# Patient Record
Sex: Male | Born: 1985 | Race: White | Hispanic: Yes | Marital: Married | Smoking: Former smoker
Health system: Southern US, Community
[De-identification: ages and names within clinical notes are randomized; demographics above are authoritative.]

## PROBLEM LIST (undated history)

## (undated) DIAGNOSIS — M549 Dorsalgia, unspecified: Secondary | ICD-10-CM

---

## 2011-09-07 ENCOUNTER — Emergency Department (HOSPITAL_COMMUNITY): Payer: Managed Care, Other (non HMO)

## 2011-09-07 ENCOUNTER — Emergency Department (HOSPITAL_COMMUNITY)
Admission: EM | Admit: 2011-09-07 | Discharge: 2011-09-07 | Disposition: A | Discharge status: Home / Self Care | Payer: Managed Care, Other (non HMO) | Attending: Emergency Medicine | Admitting: Emergency Medicine

## 2011-09-07 DIAGNOSIS — M545 Low back pain, unspecified: Secondary | ICD-10-CM | POA: Insufficient documentation

## 2011-09-07 DIAGNOSIS — Y9289 Other specified places as the place of occurrence of the external cause: Secondary | ICD-10-CM | POA: Insufficient documentation

## 2011-09-07 DIAGNOSIS — S335XXA Sprain of ligaments of lumbar spine, initial encounter: Secondary | ICD-10-CM | POA: Insufficient documentation

## 2011-09-07 DIAGNOSIS — X500XXA Overexertion from strenuous movement or load, initial encounter: Secondary | ICD-10-CM | POA: Insufficient documentation

## 2011-10-04 ENCOUNTER — Emergency Department (HOSPITAL_COMMUNITY): Payer: Managed Care, Other (non HMO)

## 2011-10-04 ENCOUNTER — Emergency Department (HOSPITAL_COMMUNITY)
Admission: EM | Admit: 2011-10-04 | Discharge: 2011-10-04 | Disposition: A | Discharge status: Home / Self Care | Payer: Managed Care, Other (non HMO) | Attending: Emergency Medicine | Admitting: Emergency Medicine

## 2011-10-04 DIAGNOSIS — S80219A Abrasion, unspecified knee, initial encounter: Secondary | ICD-10-CM

## 2011-10-04 DIAGNOSIS — IMO0002 Reserved for concepts with insufficient information to code with codable children: Secondary | ICD-10-CM | POA: Insufficient documentation

## 2011-10-04 DIAGNOSIS — M25569 Pain in unspecified knee: Secondary | ICD-10-CM | POA: Insufficient documentation

## 2011-10-04 DIAGNOSIS — S8990XA Unspecified injury of unspecified lower leg, initial encounter: Secondary | ICD-10-CM

## 2011-10-04 DIAGNOSIS — W11XXXA Fall on and from ladder, initial encounter: Secondary | ICD-10-CM | POA: Insufficient documentation

## 2011-10-04 NOTE — ED Notes (Signed)
Fell off lladder 8-10 feet yesterday and hurt  Rt knee  And left wrist hurt did not bump head  Denies loc is able to ambulate

## 2011-10-04 NOTE — Progress Notes (Signed)
Orthopedic Tech Progress Note Patient Details:  Jhon Mallozzi [facility] [birth year] [id number]   knee support   Cammer, Mickie Bail 10/04/2011, 10:56 AM

## 2011-10-04 NOTE — ED Provider Notes (Signed)
Medical screening examination/treatment/procedure(s) were performed by non-physician practitioner and as supervising physician I was immediately available for consultation/collaboration.  Cerria Randhawa, MD 10/04/11 1736 

## 2011-10-04 NOTE — ED Provider Notes (Signed)
History    patient was using ladder at work, when he lost balance and fell on his right knee. He denies hitting his head or loss of consciousness. He was able to ambulate afterward but complaining of right knee pain especially with flexion. He denies headache, neck pain, chest pain, shortness of breath, pelvic pain, hip pain, ankle pain, weakness, or numbness. He does notice an abrasion to his right patella region. He does complain of mild tenderness to left wrist was able to move, and use wrist without difficulty.  CSN: [id number] Arrival date & time: 10/04/2011  9:46 AM   First MD Initiated Contact with Patient 10/04/11 [phone]      No chief complaint on file.   (Consider location/radiation/quality/duration/timing/severity/associated sxs/prior treatment) Patient is a 25 y.o. male presenting with fall.  Fall The accident occurred 12 to 24 hours ago. The fall occurred from a ladder. He fell from a height of 6 to 10 ft. He landed on concrete. There was no blood loss. The point of impact was the right knee. The pain is present in the right knee. The pain is at a severity of 6/10. The pain is moderate. He was ambulatory at the scene. There was no entrapment after the fall. There was no drug use involved in the accident. There was no alcohol use involved in the accident. The symptoms are aggravated by flexion and use of the injured limb. He has tried nothing for the symptoms. The treatment provided no relief.    No past medical history on file.  No past surgical history on file.  No family history on file.  History  Substance Use Topics  . Smoking status: Not on file  . Smokeless tobacco: Not on file  . Alcohol Use: Not on file      Review of Systems  All other systems reviewed and are negative.    Allergies  Review of patient's allergies indicates no known allergies.  Home Medications  No current outpatient prescriptions on file.  BP 123/73  Pulse 62  Temp(Src) 98.2 F (36.8  C) (Oral)  SpO2 99%  Physical Exam  Nursing note and vitals reviewed. Constitutional:       Awake, alert, nontoxic appearance  HENT:  Head: Atraumatic.  Eyes: Right eye exhibits no discharge. Left eye exhibits no discharge.  Neck: Neck supple.  Pulmonary/Chest: Effort normal. He exhibits no tenderness.  Abdominal: There is no tenderness. There is no rebound.  Musculoskeletal: He exhibits no tenderness.       Right knee: He exhibits effusion. He exhibits no swelling, no LCL laxity and normal patellar mobility. tenderness found. Patellar tendon tenderness noted.       Right hand: He exhibits normal range of motion, no tenderness, no bony tenderness, no deformity and no swelling.       Legs:      Baseline ROM, no obvious new focal weakness  Neurological:       Mental status and motor strength appears baseline for patient and situation  Skin: No rash noted.  Psychiatric: He has a normal mood and affect.    ED Course  Procedures (including critical care time)  Labs Reviewed - No data to display No results found.   No diagnosis found.  No results found for this or any previous visit. Dg Knee Complete 4 Views Right  10/04/2011  *RADIOLOGY REPORT*  Clinical Data: Knee pain and laceration post fall  RIGHT KNEE - COMPLETE 4+ VIEW  Comparison: None  Findings: Bone mineralization  normal. Joint spaces preserved. No fracture, dislocation, or bone destruction. No joint effusion. No radiopaque foreign body identified.  IMPRESSION: No acute abnormalities.  Original Report Authenticated By: Lollie Marrow, M.D.      MDM  10:13 AM Right knee injury secondary to fall. Decrease flexion due to pain. Pain most significant to posterior aspects of patella. Abrasion noted overlying right patella.  Crepitus can be felt on palpation.  Right knee x-ray shows no acute abnormalities. Patient will be given pain medication, knee sleeves, and refer to orthopedics for further management.  Patient is able to  ambulate, and refuse crutches.        Fayrene Helper, PA Resident 10/04/11 [phone]

## 2011-10-04 NOTE — ED Notes (Signed)
Pt is ambulatory without difficulty. Denies loc when he fell from ladder. Appears in nad

## 2013-02-19 ENCOUNTER — Emergency Department (HOSPITAL_COMMUNITY): Payer: Self-pay

## 2013-02-19 ENCOUNTER — Encounter (HOSPITAL_COMMUNITY): Payer: Self-pay | Admitting: Emergency Medicine

## 2013-02-19 ENCOUNTER — Emergency Department (HOSPITAL_COMMUNITY)
Admission: EM | Admit: 2013-02-19 | Discharge: 2013-02-19 | Disposition: A | Discharge status: Home / Self Care | Payer: Self-pay | Attending: Emergency Medicine | Admitting: Emergency Medicine

## 2013-02-19 DIAGNOSIS — F17200 Nicotine dependence, unspecified, uncomplicated: Secondary | ICD-10-CM | POA: Insufficient documentation

## 2013-02-19 DIAGNOSIS — M545 Low back pain, unspecified: Secondary | ICD-10-CM | POA: Insufficient documentation

## 2013-02-19 DIAGNOSIS — G8929 Other chronic pain: Secondary | ICD-10-CM | POA: Insufficient documentation

## 2013-02-19 DIAGNOSIS — M549 Dorsalgia, unspecified: Secondary | ICD-10-CM

## 2013-02-19 MED ORDER — HYDROCODONE-ACETAMINOPHEN 5-325 MG PO TABS: 1.0000 | ORAL_TABLET | Freq: Four times a day (QID) | ORAL | Status: DC | PRN

## 2013-02-19 MED ORDER — OXYCODONE-ACETAMINOPHEN 5-325 MG PO TABS
2.0000 | ORAL_TABLET | Freq: Once | ORAL | Status: AC
  Administered 2013-02-19: 2 via ORAL
  Filled 2013-02-19: qty 2

## 2013-02-19 MED ORDER — DEXAMETHASONE SODIUM PHOSPHATE 10 MG/ML IJ SOLN
10.0000 mg | Freq: Once | INTRAMUSCULAR | Status: AC
  Administered 2013-02-19: 10 mg via INTRAMUSCULAR
  Filled 2013-02-19: qty 1

## 2013-02-19 MED ORDER — MELOXICAM 15 MG PO TABS: 15.0000 mg | ORAL_TABLET | Freq: Every day | ORAL | Status: DC

## 2013-02-19 NOTE — ED Notes (Signed)
Pt was seen by Chiropractor 09/24/12 for lower back pain.  He was seen 24 times without relief.  Pt states the back pain is increasing.  Pt states he hurt his back lifting heavy object off floor.  Pt alert oriented X4

## 2013-02-19 NOTE — ED Notes (Signed)
Back pain  X 2 weeks hurts to move in a certain way

## 2013-02-19 NOTE — ED Notes (Signed)
Patient is alert and orientedx4.  Patient was explained discharge instructions and they understood them with no questions.  The patient's mother in law Christella Hartigan Ashok Pall is coming to take the patient home.

## 2013-02-19 NOTE — ED Provider Notes (Signed)
History     CSN: [id number]  Arrival date & time 02/19/13  1152   First MD Initiated Contact with Patient 02/19/13 1207      Chief Complaint  Patient presents with  . Back Pain    (Consider location/radiation/quality/duration/timing/severity/associated sxs/prior treatment) HPI  Patient presents with cc of BL LBP.  Patient had incident in October where he lifted a heavy object at work. He has seen a chriopractor for several months without relief and has been taking advil and tylenol.  The patient states that his pain was doing better for about a month, but has been worsening over the past to weeks. Patient is having difficulty performing ADLs including putting on his own pants, bending, and walking.  Denies  shooting pain in the legs.  Denies weakness, loss of bowel/bladder function or saddle anesthesia. Denies neck stiffness, headache, rash.  Denies fever or recent procedures to back. Denies urinary symptoms.    History reviewed. No pertinent past medical history.  No past surgical history on file.  No family history on file.  History  Substance Use Topics  . Smoking status: Current Every Day Smoker  . Smokeless tobacco: Not on file  . Alcohol Use: Yes      Review of Systems .Ten systems reviewed and are negative for acute change, except as noted in the HPI.   Allergies  Review of patient's allergies indicates no known allergies.  Home Medications   Current Outpatient Rx  Name  Route  Sig  Dispense  Refill  . ibuprofen (ADVIL,MOTRIN) 200 MG tablet   Oral   Take 400 mg by mouth every 6 (six) hours as needed for pain.         Marland Kitchen OVER THE COUNTER MEDICATION   Topical   Apply 1 application topically 3 (three) times daily as needed. "El Dragon"= muscle rub (spanish)         . HYDROcodone-acetaminophen (NORCO) 5-325 MG per tablet   Oral   Take 1-2 tablets by mouth every 6 (six) hours as needed for pain.   20 tablet   0   . meloxicam (MOBIC) 15 MG tablet    Oral   Take 1 tablet (15 mg total) by mouth daily. Take 1 daily with food.   10 tablet   0     BP 128/78  Pulse 94  Temp(Src) 98.2 F (36.8 C) (Oral)  Resp 16  SpO2 97%  Physical Exam  Nursing note and vitals reviewed. Constitutional: He is oriented to person, place, and time. He appears well-developed and well-nourished. No distress.  HENT:  Head: Normocephalic and atraumatic.  Eyes: Conjunctivae are normal. No scleral icterus.  Neck: Normal range of motion. Neck supple.  Cardiovascular: Normal rate, regular rhythm and normal heart sounds.   Pulmonary/Chest: Effort normal and breath sounds normal. No respiratory distress.  Abdominal: Soft. There is no tenderness.  Musculoskeletal: He exhibits tenderness. He exhibits no edema.  Back exam: antalgic gait, limited range of motion, tenderness noted lumbar paraspinals,no midline tenderness, normal reflexes and strength bilateral lower extremities,  Pain with BL hip flexion.   Neurological: He is alert and oriented to person, place, and time.  Skin: Skin is warm and dry. He is not diaphoretic.  Psychiatric: His behavior is normal.    ED Course  Procedures (including critical care time)  Labs Reviewed - No data to display Dg Lumbar Spine Complete  02/19/2013  *RADIOLOGY REPORT*  Clinical Data: 27 year old male low back pain radiating to the lower  extremities.  LUMBAR SPINE - COMPLETE 4+ VIEW  Comparison: 09/07/2011.  Findings: Normal thoracic segmentation. Bone mineralization is within normal limits.  Stable lumbar vertebral height and alignment.  No pars fracture.  Sacral ala and SI joints within normal limits. Visible lower thoracic levels appear stable and intact.  IMPRESSION: No acute osseous abnormality in the lumbar spine.   Original Report Authenticated By: Erskine Speed, M.D.      1. Back pain       MDM  7:26 PM BP 128/78  Pulse 94  Temp(Src) 98.2 F (36.8 C) (Oral)  Resp 16  SpO2 97% ppatient with negative xray.   I suspect some psoas spasm and spasm of the lumbar,  Negative straigh leg raises.  I will d/c paitent with pain control and ortho follow up. Patient with back pain.  No neurological deficits and normal neuro exam.  Patient can walk but states is painful.  No loss of bowel or bladder control.  No concern for cauda equina.  No fever, night sweats, weight loss, h/o cancer, IVDU.  RICE protocol and pain medicine indicated and discussed with patient.  The patient appears reasonably screened and/or stabilized for discharge and I doubt any other medical condition or other [facility] requiring further screening, evaluation, or treatment in the ED at this time prior to discharge.         Arthor Captain, PA-C 02/19/13 1928

## 2013-02-19 NOTE — ED Notes (Signed)
Has seen a chiropractor many times but he has not helped

## 2013-02-21 NOTE — ED Provider Notes (Signed)
Medical screening examination/treatment/procedure(s) were performed by non-physician practitioner and as supervising physician I was immediately available for consultation/collaboration.  Reuven Braver, MD 02/21/13 1648 

## 2013-02-24 ENCOUNTER — Other Ambulatory Visit (HOSPITAL_COMMUNITY): Payer: Self-pay | Admitting: Sports Medicine

## 2013-02-24 DIAGNOSIS — M545 Low back pain: Secondary | ICD-10-CM

## 2013-02-27 ENCOUNTER — Ambulatory Visit (HOSPITAL_COMMUNITY)
Admission: RE | Admit: 2013-02-27 | Discharge: 2013-02-27 | Disposition: A | Discharge status: Home / Self Care | Payer: Self-pay | Source: Ambulatory Visit | Attending: Sports Medicine | Admitting: Sports Medicine

## 2013-02-27 DIAGNOSIS — M545 Low back pain: Secondary | ICD-10-CM

## 2013-02-27 DIAGNOSIS — R209 Unspecified disturbances of skin sensation: Secondary | ICD-10-CM | POA: Insufficient documentation

## 2013-02-27 DIAGNOSIS — M5126 Other intervertebral disc displacement, lumbar region: Secondary | ICD-10-CM | POA: Insufficient documentation

## 2013-04-17 ENCOUNTER — Ambulatory Visit: Payer: Self-pay | Attending: Sports Medicine

## 2013-04-17 DIAGNOSIS — IMO0001 Reserved for inherently not codable concepts without codable children: Secondary | ICD-10-CM | POA: Insufficient documentation

## 2013-04-17 DIAGNOSIS — M545 Low back pain, unspecified: Secondary | ICD-10-CM | POA: Insufficient documentation

## 2013-04-21 ENCOUNTER — Ambulatory Visit: Payer: Self-pay

## 2013-04-23 ENCOUNTER — Ambulatory Visit: Payer: Self-pay | Admitting: Physical Therapy

## 2013-04-27 ENCOUNTER — Ambulatory Visit: Payer: Self-pay | Attending: Sports Medicine

## 2013-04-27 DIAGNOSIS — IMO0001 Reserved for inherently not codable concepts without codable children: Secondary | ICD-10-CM | POA: Insufficient documentation

## 2013-04-27 DIAGNOSIS — M545 Low back pain, unspecified: Secondary | ICD-10-CM | POA: Insufficient documentation

## 2013-04-29 ENCOUNTER — Ambulatory Visit: Payer: Self-pay | Admitting: Rehabilitation

## 2013-05-04 ENCOUNTER — Ambulatory Visit: Payer: Self-pay

## 2013-05-06 ENCOUNTER — Ambulatory Visit: Payer: Self-pay

## 2013-05-12 ENCOUNTER — Ambulatory Visit: Payer: Self-pay

## 2013-05-14 ENCOUNTER — Ambulatory Visit: Payer: Self-pay

## 2013-05-19 ENCOUNTER — Ambulatory Visit: Payer: Self-pay

## 2013-05-21 ENCOUNTER — Ambulatory Visit: Payer: Self-pay

## 2014-01-06 ENCOUNTER — Encounter (HOSPITAL_COMMUNITY): Payer: Self-pay | Admitting: Emergency Medicine

## 2014-01-06 ENCOUNTER — Emergency Department (INDEPENDENT_AMBULATORY_CARE_PROVIDER_SITE_OTHER)
Admission: EM | Admit: 2014-01-06 | Discharge: 2014-01-06 | Disposition: A | Discharge status: Home / Self Care | Payer: Self-pay | Source: Home / Self Care | Attending: Family Medicine | Admitting: Family Medicine

## 2014-01-06 DIAGNOSIS — A084 Viral intestinal infection, unspecified: Secondary | ICD-10-CM

## 2014-01-06 DIAGNOSIS — A088 Other specified intestinal infections: Secondary | ICD-10-CM

## 2014-01-06 MED ORDER — ONDANSETRON 4 MG PO TBDP
ORAL_TABLET | ORAL | Status: AC
  Filled 2014-01-06: qty 2

## 2014-01-06 MED ORDER — ONDANSETRON 4 MG PO TBDP
8.0000 mg | ORAL_TABLET | Freq: Once | ORAL | Status: AC
  Administered 2014-01-06: 8 mg via ORAL

## 2014-01-06 MED ORDER — PROMETHAZINE HCL 25 MG PO TABS: 25.0000 mg | ORAL_TABLET | Freq: Four times a day (QID) | ORAL | Status: DC | PRN

## 2014-01-06 NOTE — ED Provider Notes (Signed)
Willie Carter is a 28 y.o. male who presents to Urgent Care today for fever and diarrhea. Symptoms started yesterday. Fever at home was up to 102F. He's tried Tylenol which helped significantly. He also notes mild headache. No cough or congestion or trouble breathing. No significant sick contacts. No vomiting. He feels well otherwise. No significant abdominal pain.   No past medical history on file. History  Substance Use Topics  . Smoking status: Current Every Day Smoker  . Smokeless tobacco: Not on file  . Alcohol Use: Yes   ROS as above Medications: Current Facility-Administered Medications  Medication Dose Route Frequency Provider Last Rate Last Dose  . ondansetron (ZOFRAN-ODT) disintegrating tablet 8 mg  8 mg Oral Once Gregor Hams, MD       Current Outpatient Prescriptions  Medication Sig Dispense Refill  . ibuprofen (ADVIL,MOTRIN) 200 MG tablet Take 400 mg by mouth every 6 (six) hours as needed for pain.      Marland Kitchen OVER THE COUNTER MEDICATION Apply 1 application topically 3 (three) times daily as needed. "El Dragon"= muscle rub (spanish)      . promethazine (PHENERGAN) 25 MG tablet Take 1 tablet (25 mg total) by mouth every 6 (six) hours as needed for nausea or vomiting.  30 tablet  0    Exam:  BP 106/74  Pulse 88  Temp(Src) 100.1 F (37.8 C) (Oral)  Resp 19  SpO2 100% Gen: Well NAD HEENT: EOMI,  MMM Lungs: Normal work of breathing. CTABL Heart: RRR no MRG Abd: NABS, Soft. NT, ND no rebound or guarding Exts: Brisk capillary refill, warm and well perfused.    Assessment and Plan: 28 y.o. male with viral gastroenteritis. Plan to treat with Phenergan Tylenol and watchful waiting. Handout provided in [place].  Discussed warning signs or symptoms. Please see discharge instructions. Patient expresses understanding.    Gregor Hams, MD 01/06/14 [phone]

## 2014-01-06 NOTE — ED Notes (Signed)
Delay in discharge related to obtaining translator support.

## 2014-01-06 NOTE — Discharge Instructions (Signed)
Thank you for coming in today. Take phenergan as needed for vomiting.  Continue tylenol for fever.  If your belly pain worsens, or you have high fever, bad vomiting, blood in your stool or black tarry stool go to the Emergency Room.   Gastroenteritis viral (Viral Gastroenteritis) La gastroenteritis viral tambin es conocida como gripe del [place]. Este trastorno [employer] y el tubo digestivo. Puede causar diarrea y vmitos repentinos. La enfermedad generalmente dura entre 3 y 8 das. La [employer] de las personas desarrolla una respuesta inmunolgica. Con el tiempo, esto elimina el virus. Mientras se desarrolla esta respuesta natural, el virus puede afectar en forma importante su salud.  CAUSAS Muchos virus diferentes pueden causar gastroenteritis, por ejemplo el rotavirus o el norovirus. Estos virus pueden contagiarse al consumir alimentos o agua contaminados. Tambin puede contagiarse al compartir utensilios u otros artculos personales con una persona infectada o al tocar una superficie contaminada.  SNTOMAS Los sntomas ms comunes son diarrea y vmitos. Estos problemas pueden causar una prdida grave de lquidos corporales(deshidratacin) y un desequilibrio de sales corporales(electrolitos). Otros sntomas pueden ser:   Cristy Hilts.  Dolor de [place].  Fatiga.  Dolor abdominal. DIAGNSTICO  El mdico podr hacer el diagnstico de gastroenteritis viral basndose en los sntomas y el examen fsico Tambin pueden tomarle una muestra de materia fecal para diagnosticar la presencia de virus u otras infecciones.  TRATAMIENTO Esta enfermedad generalmente desaparece sin tratamiento. Los tratamientos estn dirigidos a Neurosurgeon. Los casos ms graves de gastroenteritis viral implican vmitos tan intensos que no es posible retener lquidos. En [employer], los lquidos deben administrarse a travs de una va intravenosa (IV).  INSTRUCCIONES PARA EL CUIDADO DOMICILIARIO  Beba suficientes  lquidos para mantener la orina clara o de color amarillo plido. Beba pequeas cantidades de lquido con frecuencia y aumente la cantidad segn la tolerancia.  Pida instrucciones especficas a su mdico con respecto a la rehidratacin.  Evite:  Alimentos que Science writer.  Alcohol.  Gaseosas.  TabacoEden Emms.  Bebidas con cafena.  Lquidos muy calientes o fros.  Alimentos muy grasos.  Comer demasiado a Radiographer, therapeutic.  Productos lcteos hasta 24 a 48 horas despus de que se detenga la diarrea.  Puede consumir probiticos. Los probiticos son cultivos activos de bacterias beneficiosas. Pueden disminuir la cantidad y el nmero de deposiciones diarreicas en el adulto. Se encuentran en los yogures con cultivos activos y en los suplementos.  Lave bien sus manos para evitar que se disemine el virus.  Slo tome medicamentos de venta libre o recetados para Glass blower/designer, las molestias o bajar la fiebre segn las indicaciones de su mdico. No administre aspirina a los nios. Los medicamentos antidiarreicos no son recomendables.  Consulte a su mdico si puede seguir tomando sus medicamentos recetados o de [employer].  Cumpla con todas las visitas de control, segn le indique su mdico. SOLICITE ATENCIN MDICA DE INMEDIATO SI:  No puede retener lquidos.  No hay emisin de orina durante 6 a 8 horas.  Le falta el aire.  Observa sangre en el vmito (se ve como caf molido) o en la materia fecal.  Siente dolor abdominal que empeora o se concentra en una zona pequea (se localiza).  Tiene nuseas o vmitos persistentes.  Tiene fiebre.  El paciente es un nio menor de 3 meses y [place].  El paciente es un nio mayor de 3 meses, tiene fiebre y sntomas persistentes.  El paciente es un nio mayor de 3  meses y tiene fiebre y sntomas que empeoran repentinamente.  El paciente es un beb y no tiene lgrimas cuando llora. ASEGRESE QUE:   Comprende estas  instrucciones.  Controlar su enfermedad.  Solicitar ayuda inmediatamente si no mejora o si empeora. Document Released: 11/12/2005 Document Revised: 02/04/2012 [facility] Patient Information 2014 [place], [place].

## 2014-01-06 NOTE — ED Notes (Signed)
Fever onset last night.  Seen by dr Georgina Snell prior to this nurse

## 2015-07-18 ENCOUNTER — Encounter: Payer: Self-pay | Admitting: Family Medicine

## 2015-07-18 ENCOUNTER — Ambulatory Visit (INDEPENDENT_AMBULATORY_CARE_PROVIDER_SITE_OTHER): Payer: 59 | Admitting: Family Medicine

## 2015-07-18 VITALS — BP 104/70 | HR 57 | Wt 223.0 lb

## 2015-07-18 DIAGNOSIS — M9905 Segmental and somatic dysfunction of pelvic region: Secondary | ICD-10-CM | POA: Diagnosis not present

## 2015-07-18 DIAGNOSIS — M9903 Segmental and somatic dysfunction of lumbar region: Secondary | ICD-10-CM

## 2015-07-18 DIAGNOSIS — M545 Low back pain, unspecified: Secondary | ICD-10-CM

## 2015-07-18 DIAGNOSIS — Q7649 Other congenital malformations of spine, not associated with scoliosis: Secondary | ICD-10-CM | POA: Diagnosis not present

## 2015-07-18 DIAGNOSIS — M999 Biomechanical lesion, unspecified: Secondary | ICD-10-CM | POA: Insufficient documentation

## 2015-07-18 DIAGNOSIS — M9904 Segmental and somatic dysfunction of sacral region: Secondary | ICD-10-CM

## 2015-07-18 MED ORDER — GABAPENTIN 100 MG PO CAPS: 100.0000 mg | ORAL_CAPSULE | Freq: Every day | ORAL | Status: DC

## 2015-07-18 NOTE — Progress Notes (Signed)
[name] [address], [place] [address] Phone: [phone] Subjective:     CC: Low back pain  NID:[id number] Willie Carter is a 29 y.o. male coming in with complaint of low back pain. Patient has had this pain for multiple years. Patient states that he had some pain that seemed to be exacerbated after an injury at work in 2014. Patient did have a workup for this previously. Patient had an MRI in 2014 that was reviewed by me. 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 Patient did undergo formal physical therapy but states that over the course of 24 weeks he did not have significant improvement. States that it seems that if he is in a flexed position for a great amount of time he has more of a tightness. Patient states no radiation down the leg but sometimes into the right buttocks. Always seems to be worse on the right side. Denies any weakness. States that at night can be difficult to follow sleep secondary to the discomfort. Denies any fevers chills or any abnormal weight loss. States that he was approximately 25-30 pounds lighter last year and was feeling better.  No past medical history on file. No past surgical history on file. Social History  Substance Use Topics  . Smoking status: Former Research scientist (life sciences)  . Smokeless tobacco: None  . Alcohol Use: No   No Known Allergies No family history on file.      Past medical history, social, surgical and family history all reviewed in electronic medical record.   Review of Systems: No headache, visual changes, nausea, vomiting, diarrhea, constipation, dizziness, abdominal pain, skin rash, fevers, chills, night sweats, weight loss, swollen lymph nodes, body aches, joint swelling, muscle aches, chest pain, shortness of breath, mood changes.   Objective Blood pressure 104/70, pulse 57, weight 223 lb (101.152  kg), SpO2 96 %.  General: No apparent distress alert and oriented x3 mood and affect normal, dressed appropriately.  HEENT: Pupils equal, extraocular movements intact  Respiratory: Patient's speak in full sentences and does not appear short of breath  Cardiovascular: No lower extremity edema, non tender, no erythema  Skin: Warm dry intact with no signs of infection or rash on extremities or on axial skeleton.  Abdomen: Soft nontender  Neuro: Cranial nerves II through XII are intact, neurovascularly intact in all extremities with 2+ DTRs and 2+ pulses.  Lymph: No lymphadenopathy of posterior or anterior cervical chain or axillae bilaterally.  Gait normal with good balance and coordination.  MSK:  Non tender with full range of motion and good stability and symmetric strength and tone of shoulders, elbows, wrist, hip, knee and ankles bilaterally.  Back Exam:  Inspection: Unremarkable poor core strength Motion: Flexion 35 deg, Extension 25 deg, Side Bending to 25 deg bilaterally,  Rotation to 25 deg bilaterally  SLR laying: Negative  XSLR laying: Negative  Palpable tenderness: Tenderness in the paraspinal musculature of the lumbar spine mostly over L4 and L5 as well as somewhat over the piriformis muscle. FABER: Positive right side Sensory change: Gross sensation intact to all lumbar and sacral dermatomes.  Reflexes: 2+ at both patellar tendons, 2+ at achilles tendons, Babinski's downgoing.  Strength at foot  Plantar-flexion: 5/5 Dorsi-flexion: 5/5 Eversion: 5/5 Inversion: 5/5  Leg strength  Quad: 5/5 Hamstring: 5/5 Hip flexor: 5/5 Hip abductors: 5/5  Gait unremarkable.  Osteopathic findings L2 flexed rotated and  side bent right Sacrum left on left Right ilium posterior   Impression and Recommendations:     This case required medical decision making of moderate complexity.

## 2015-07-18 NOTE — Assessment & Plan Note (Signed)
Patient does have low back pain. I think is mostly muscular skeletal. Patient does have the congenital spinal stenosis that likely contributing as well. We discussed though with patient about home exercises, icing protocol, as well as over-the-counter natural supplementations. She continues ibuprofen for breakthrough pain and will take gabapentin at night. Patient is having no radicular symptoms I think that this will likely be fine and patient will respond well to conservative therapy. Patient come back in 3-4 weeks for further evaluation and treatment.

## 2015-07-18 NOTE — Progress Notes (Signed)
Pre visit review using our clinic review tool, if applicable. No additional management support is needed unless otherwise documented below in the visit note. 

## 2015-07-18 NOTE — Assessment & Plan Note (Signed)
Decision today to treat with OMT was based on Physical Exam  After verbal consent patient was treated with HVLA, ME techniques in lumbar, sacral and pelvis areas  Patient tolerated the procedure well with improvement in symptoms  Patient given exercises, stretches and lifestyle modifications  See medications in patient instructions if given  Patient will follow up in 3-4 weeks

## 2015-07-18 NOTE — Patient Instructions (Addendum)
Good to see you.  Ice 20 minutes 2 times daily. Usually after activity and before bed. Exercises 3 times a week.  Tennisball in back right pocket with sitting a lot Sacroiliac Joint Mobilization and Rehab 1. Work on pretzel stretching, shoulder back and leg draped in front. 3-5 sets, 30 sec.. 2. hip abductor rotations. standing, hip flexion and rotation outward then inward. 3 sets, 15 reps. when can do comfortably, add ankle weights starting at 2 pounds.  3. cross over stretching - shoulder back to ground, same side leg crossover. 3-5 sets for 30 min..  4. rolling up and back knees to chest and rocking. 5. sacral tilt - 5 sets, hold for 5-10 seconds Exercises on wall.  Heel and butt touching.  Raise leg 6 inches and hold 2 seconds.  Down slow for count of 4 seconds.  1 set of 30 reps daily on both sides.  Turmeric 500mg  twice daily Vitamin D 2ooo IU daily Gabapentin 100mg  at night See me again in 3-4 weeks.

## 2015-08-15 ENCOUNTER — Encounter: Payer: Self-pay | Admitting: Family Medicine

## 2015-08-15 ENCOUNTER — Ambulatory Visit (INDEPENDENT_AMBULATORY_CARE_PROVIDER_SITE_OTHER)
Admission: RE | Admit: 2015-08-15 | Discharge: 2015-08-15 | Disposition: A | Discharge status: Home / Self Care | Payer: 59 | Source: Ambulatory Visit | Attending: Family Medicine | Admitting: Family Medicine

## 2015-08-15 ENCOUNTER — Ambulatory Visit (INDEPENDENT_AMBULATORY_CARE_PROVIDER_SITE_OTHER): Payer: 59 | Admitting: Family Medicine

## 2015-08-15 VITALS — BP 110/68 | HR 76 | Ht 66.0 in | Wt 220.0 lb

## 2015-08-15 DIAGNOSIS — M9904 Segmental and somatic dysfunction of sacral region: Secondary | ICD-10-CM | POA: Diagnosis not present

## 2015-08-15 DIAGNOSIS — M9905 Segmental and somatic dysfunction of pelvic region: Secondary | ICD-10-CM | POA: Diagnosis not present

## 2015-08-15 DIAGNOSIS — Q7649 Other congenital malformations of spine, not associated with scoliosis: Secondary | ICD-10-CM | POA: Diagnosis not present

## 2015-08-15 DIAGNOSIS — M9903 Segmental and somatic dysfunction of lumbar region: Secondary | ICD-10-CM | POA: Diagnosis not present

## 2015-08-15 DIAGNOSIS — M545 Low back pain, unspecified: Secondary | ICD-10-CM

## 2015-08-15 DIAGNOSIS — M25511 Pain in right shoulder: Secondary | ICD-10-CM | POA: Diagnosis not present

## 2015-08-15 DIAGNOSIS — M999 Biomechanical lesion, unspecified: Secondary | ICD-10-CM

## 2015-08-15 NOTE — Assessment & Plan Note (Signed)
Patient had more pain today than he said previously. There is a possibility for impingement as well as a possible labral pathology. No significant laxity but patient does have what appears to be more some some mild subluxation of the bicep tendon bilaterally. Patient given some home exercises and work with Product/process development scientist. We discussed icing regimen. Patient come back and see me again in 4-6 weeks. If continuing have pain we'll consider injection.

## 2015-08-15 NOTE — Patient Instructions (Signed)
Good to see you Ice is your friend Gabapentin to 200mg  at night New handout of exercises to alternate with the old With sitting a long time put a tennis ball in back left pocket.  We can do injection if needed See me again in 4-6 weeks.

## 2015-08-15 NOTE — Progress Notes (Signed)
Pre visit review using our clinic review tool, if applicable. No additional management support is needed unless otherwise documented below in the visit note. 

## 2015-08-15 NOTE — Assessment & Plan Note (Signed)
I do believe the patient having the congenital spinal stenosis you have flare from time to time. Encourage him Core strengthening exercises and remaining active will be the most beneficial things he can do. Patient will take the gabapentin still at night. We discussed icing regimen again in greater detail. We discussed proper lifting mechanics. Patient has any worsening symptoms we can consider an epidural pressure repeat imaging. We'll discuss that at follow-up again in 4-6 weeks.

## 2015-08-15 NOTE — Progress Notes (Signed)
[facility] [employer] [place], [place] [address] Phone: [phone] Subjective:     CC: Low back pain f/u  [id number] Willie Carter is a 29 y.o. male coming in with complaint of low back pain. History of moderate spinal stenosis. Patient's was started on gabapentin, home exercises, postural changes, and osteopathic manipulation. Patient states he did make some improvement for some time and then seems to be worsening. Patient had to do some yard work and states that the repetitive activity seem to make it worse. Denies any radiation down the leg that is significant but has intermittent on the left leg. Denies any weakness. States that he's been able to sleep fairly comfortably. Patient remains fairly active and continues to work most of the time he states.  Patient also complaining of right shoulder pain. Feels that it sometimes unstable. Has a clicking sensation that is painful. Seems to be on the anterior aspect of the shoulder. States that he did have a dislocation he feels like one week ago. Not truly from a fall. Patient denies any radiation down the hand of his states that the arm feels somewhat weak.  No past medical history on file. No past surgical history on file. Social History  Substance Use Topics  . Smoking status: Former Research scientist (life sciences)  . Smokeless tobacco: None  . Alcohol Use: No   No Known Allergies No family history on file.      Past medical history, social, surgical and family history all reviewed in electronic medical record.   Review of Systems: No headache, visual changes, nausea, vomiting, diarrhea, constipation, dizziness, abdominal pain, skin rash, fevers, chills, night sweats, weight loss, swollen lymph nodes, body aches, joint swelling, muscle aches, chest pain, shortness of breath, mood changes.   Objective Blood pressure 110/68, pulse 76, height 5\' 6"  (1.676 m), weight 220 lb (99.791 kg), SpO2 96 %.  General: No  apparent distress alert and oriented x3 mood and affect normal, dressed appropriately.  HEENT: Pupils equal, extraocular movements intact  Respiratory: Patient's speak in full sentences and does not appear short of breath  Cardiovascular: No lower extremity edema, non tender, no erythema  Skin: Warm dry intact with no signs of infection or rash on extremities or on axial skeleton.  Abdomen: Soft nontender  Neuro: Cranial nerves II through XII are intact, neurovascularly intact in all extremities with 2+ DTRs and 2+ pulses.  Lymph: No lymphadenopathy of posterior or anterior cervical chain or axillae bilaterally.  Gait normal with good balance and coordination.  MSK:  Non tender with full range of motion and good stability and symmetric strength and tone of  elbows, wrist, hip, knee and ankles bilaterally.  Shoulder: Right Inspection reveals no abnormalities, atrophy or asymmetry. Tender to palpation over the acromioclavicular joint and the bicep groove bilaterally right greater than left ROM is full in all planes. Rotator cuff strength normal throughout. Positive impingement with Neer and Hawkin's tests, empty can sign. Speeds and Yergason's tests normal. Positive labral pathology Normal scapular function observed. No painful arc and no drop arm sign. No apprehension sign     Back Exam:  Inspection: Continued poor core strength Motion: Flexion 35 deg, Extension 25 deg, Side Bending to 25 deg bilaterally,  Rotation to 25 deg bilaterally  SLR laying: Negative  XSLR laying: Negative  Palpable tenderness: Continue paraspinal musculature tenderness diffusely FABER: Continued tenderness on the right side Sensory change: Gross sensation intact to all lumbar and sacral dermatomes.  Reflexes:  2+ at both patellar tendons, 2+ at achilles tendons, Babinski's downgoing.  Strength at foot  Plantar-flexion: 5/5 Dorsi-flexion: 5/5 Eversion: 5/5 Inversion: 5/5  Leg strength  Quad: 5/5 Hamstring:  5/5 Hip flexor: 5/5 Hip abductors: 5/5  Gait unremarkable.  Osteopathic findings Thoracic T5 extended rotated inside that right L2 flexed rotated and side bent right Sacrum left on left Right ilium posterior  Procedure note 97110; 15 minutes spent for Therapeutic exercises as stated in above notes.  This included exercises focusing on stretching, strengthening, with significant focus on eccentric aspects. Shoulder Exercises that included:  Basic scapular stabilization to include adduction and depression of scapula Scaption, focusing on proper movement and good control Internal and External rotation utilizing a theraband, with elbow tucked at side entire time Rows with theraband  Proper technique shown and discussed handout in great detail with ATC.  All questions were discussed and answered.     Impression and Recommendations:     This case required medical decision making of moderate complexity.

## 2015-08-15 NOTE — Assessment & Plan Note (Signed)
Decision today to treat with OMT was based on Physical Exam  After verbal consent patient was treated with HVLA, ME techniques in lumbar, sacral and pelvis areas  Patient tolerated the procedure well with improvement in symptoms  Patient given exercises, stretches and lifestyle modifications  See medications in patient instructions if given  Patient will follow up in 4-6weeks

## 2015-09-19 ENCOUNTER — Encounter: Payer: Self-pay | Admitting: Family Medicine

## 2015-09-19 ENCOUNTER — Ambulatory Visit (INDEPENDENT_AMBULATORY_CARE_PROVIDER_SITE_OTHER): Payer: 59 | Admitting: Family Medicine

## 2015-09-19 VITALS — BP 132/84 | HR 87 | Ht 66.0 in | Wt 219.0 lb

## 2015-09-19 DIAGNOSIS — M9905 Segmental and somatic dysfunction of pelvic region: Secondary | ICD-10-CM

## 2015-09-19 DIAGNOSIS — Q7649 Other congenital malformations of spine, not associated with scoliosis: Secondary | ICD-10-CM

## 2015-09-19 DIAGNOSIS — M9903 Segmental and somatic dysfunction of lumbar region: Secondary | ICD-10-CM | POA: Diagnosis not present

## 2015-09-19 DIAGNOSIS — M25511 Pain in right shoulder: Secondary | ICD-10-CM

## 2015-09-19 DIAGNOSIS — M9904 Segmental and somatic dysfunction of sacral region: Secondary | ICD-10-CM

## 2015-09-19 DIAGNOSIS — M999 Biomechanical lesion, unspecified: Secondary | ICD-10-CM

## 2015-09-19 MED ORDER — IBUPROFEN-FAMOTIDINE 800-26.6 MG PO TABS: ORAL_TABLET | ORAL | Status: DC

## 2015-09-19 NOTE — Assessment & Plan Note (Signed)
Continues to have some signs radicular symptoms. We discussed with him that we may need to consider possible epidural. Discuss that further date if patient does not respond to the conservative therapy. Encourage him to continue to work on weight loss and posture. Patient is going to look into some weight loss supplements. We discussed that I do not think that prescription medications would be helpful. Patient declined nutrition consult. Patient will come back and see me again in 4 weeks for further evaluation and treatment.

## 2015-09-19 NOTE — Progress Notes (Signed)
[name] [place], [place] [address] Phone: [phone] Subjective:     CC: Low back pain f/u  Willie Carter is a 29 y.o. male coming in with complaint of low back pain. History of moderate spinal stenosis. Patient's was started on gabapentin, home exercises, postural changes, and osteopathic manipulation. Patient continues to make some small increasing and decreasing the pain. Patient still has some exacerbations from time to time. Taking gabapentin fairly regularly. Feels that regular ibuprofen has not been helpful. Would like to do something stronger. Has not been icing as mentioned when he does do the stretches it does feel better. No radicular symptoms down the legs..  Patient also complaining of right shoulder pain.not doing the exercises regularly. Continues to have a dull throbbing aching pain. Seems to come and go. States that maybe a little bit better from time to time. Still can stop him from activities second due to pain.  No past medical history on file. No past surgical history on file. Social History  Substance Use Topics  . Smoking status: Former Research scientist (life sciences)  . Smokeless tobacco: None  . Alcohol Use: No   No Known Allergies No family history on file.      Past medical history, social, surgical and family history all reviewed in electronic medical record.   Review of Systems: No headache, visual changes, nausea, vomiting, diarrhea, constipation, dizziness, abdominal pain, skin rash, fevers, chills, night sweats, weight loss, swollen lymph nodes, body aches, joint swelling, muscle aches, chest pain, shortness of breath, mood changes.   Objective Blood pressure 132/84, pulse 87, height 5\' 6"  (1.676 m), weight 219 lb (99.338 kg), SpO2 96 %.  General: No apparent distress alert and oriented x3 mood and affect normal, dressed appropriately.  HEENT: Pupils equal, extraocular movements intact  Respiratory:  Patient's speak in full sentences and does not appear short of breath  Cardiovascular: No lower extremity edema, non tender, no erythema  Skin: Warm dry intact with no signs of infection or rash on extremities or on axial skeleton.  Abdomen: Soft nontender  Neuro: Cranial nerves II through XII are intact, neurovascularly intact in all extremities with 2+ DTRs and 2+ pulses.  Lymph: No lymphadenopathy of posterior or anterior cervical chain or axillae bilaterally.  Gait normal with good balance and coordination.  MSK:  Non tender with full range of motion and good stability and symmetric strength and tone of  elbows, wrist, hip, knee and ankles bilaterally.  Shoulder: Right Inspection reveals no abnormalities, atrophy or asymmetry. Tender to palpation over the acromioclavicular joint and the bicep groove bilaterally right greater than left ROM is full in all planes. Rotator cuff strength normal throughout. Positive impingement with Neer and Hawkin's tests, empty can sign. Speeds and Yergason's tests normal. Positive labral pathology Normal scapular function observed. No painful arc and no drop arm sign. No apprehension sign No change from previous exam  Body mass index is 35.36 kg/(m^2).    Back Exam:  Inspection: Continued poor core strength Motion: Flexion 35 deg, Extension 25 deg, Side Bending to 25 deg bilaterally,  Rotation to 25 deg bilaterally  SLR laying: Negative  XSLR laying: Negative  Palpable tenderness: Continue paraspinal musculature tenderness diffusely FABER: Continued tenderness on the right side Sensory change: Gross sensation intact to all lumbar and sacral dermatomes.  Reflexes: 2+ at both patellar tendons, 2+ at achilles tendons, Babinski's downgoing.  Strength at foot  Plantar-flexion: 5/5 Dorsi-flexion: 5/5 Eversion: 5/5 Inversion:  5/5  Leg strength  Quad: 5/5 Hamstring: 5/5 Hip flexor: 5/5 Hip abductors: 5/5  Gait unremarkable.  Osteopathic  findings Thoracic T5 extended rotated inside that right L2 flexed rotated and side bent right Sacrum left on left Right ilium posterior   After informed written and verbal consent, patient was seated on exam table. Right shoulder was prepped with alcohol swab and utilizing posterior approach, patient's right glenohumeral space was injected with 4:1  marcaine 0.5%: Kenalog 40mg /dL. Patient tolerated the procedure well without immediate complications.     Impression and Recommendations:     This case required medical decision making of moderate complexity.

## 2015-09-19 NOTE — Patient Instructions (Addendum)
Good to see you Ice 20 minutes 2 times daily. Usually after activity and before bed and ice shoulder in 6 hours Try to do those exercises 3 times a week for the shoulder and back.  We can consider a epidural for the back at some pointif needed Continue to stay active.  Look into to weight loss clinics See me again in 4 weeks.

## 2015-09-19 NOTE — Assessment & Plan Note (Signed)
Decision today to treat with OMT was based on Physical Exam  After verbal consent patient was treated with HVLA, ME techniques in lumbar, sacral and pelvis areas  Patient tolerated the procedure well with improvement in symptoms  Patient given exercises, stretches and lifestyle modifications  See medications in patient instructions if given  Patient will follow up in 4-6weeks

## 2015-09-19 NOTE — Progress Notes (Signed)
Pre visit review using our clinic review tool, if applicable. No additional management support is needed unless otherwise documented below in the visit note. 

## 2015-09-19 NOTE — Assessment & Plan Note (Signed)
Discussed with patient again at length. Patient elected try an injection today. Tolerated the procedure very well. Hopefully this will be beneficial. Declined formal physical therapy. Patient will monitor and keep hands within peripheral vision. We discussed lifting mechanics. Patient come back and see me again in 4 weeks. Continued have pain advance imaging may be warranted.

## 2015-10-13 ENCOUNTER — Telehealth: Payer: Self-pay | Admitting: Family Medicine

## 2015-10-13 NOTE — Telephone Encounter (Signed)
Wife called in.  She states that patients back is hurting really bad.  States he can not put his head down, take a shower or hardly drive.  Would like to be worked in around lunch if possible because patient is 3 hours away.  If you can not reach patient, wife is asking to try her at [phone] ([email or web address]).

## 2015-10-13 NOTE — Telephone Encounter (Signed)
Spoke to wend, the pt's wife. Scheduled him for tomorroat @930a .

## 2015-10-14 ENCOUNTER — Ambulatory Visit (INDEPENDENT_AMBULATORY_CARE_PROVIDER_SITE_OTHER): Payer: 59 | Admitting: Family Medicine

## 2015-10-14 ENCOUNTER — Encounter: Payer: Self-pay | Admitting: Family Medicine

## 2015-10-14 ENCOUNTER — Ambulatory Visit (INDEPENDENT_AMBULATORY_CARE_PROVIDER_SITE_OTHER)
Admission: RE | Admit: 2015-10-14 | Discharge: 2015-10-14 | Disposition: A | Discharge status: Home / Self Care | Payer: 59 | Source: Ambulatory Visit | Attending: Family Medicine | Admitting: Family Medicine

## 2015-10-14 VITALS — BP 120/84 | HR 70 | Ht 66.0 in | Wt 218.0 lb

## 2015-10-14 DIAGNOSIS — Q7649 Other congenital malformations of spine, not associated with scoliosis: Secondary | ICD-10-CM

## 2015-10-14 MED ORDER — PREDNISONE 50 MG PO TABS: 50.0000 mg | ORAL_TABLET | Freq: Every day | ORAL | Status: DC

## 2015-10-14 MED ORDER — KETOROLAC TROMETHAMINE 30 MG/ML IJ SOLN
60.0000 mg | Freq: Once | INTRAMUSCULAR | Status: AC
  Administered 2015-10-14: 60 mg via INTRAMUSCULAR

## 2015-10-14 MED ORDER — METHYLPREDNISOLONE ACETATE 80 MG/ML IJ SUSP
80.0000 mg | Freq: Once | INTRAMUSCULAR | Status: AC
  Administered 2015-10-14: 80 mg via INTRAMUSCULAR

## 2015-10-14 NOTE — Assessment & Plan Note (Signed)
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 Last imaging was greater than 2 years ago. X-rays are going to be ordered today. With patient having radicular symptoms I do think that there is a possibility for patient having a nerve root impingement or exacerbation of the spinal stenosis. Patient would like to try conservative therapy. Patient will try prednisone, continue on the gabapentin, has medications for breakthrough pain. I would like to get labs to rule out any other possible pathology that can be contributing such as autoimmune diseases, or infectious etiology. Patient will come back after the weekend. If continuing to have the discomfort at think that advance imaging could be appropriate. Patient knows if any weakness, numbness or any bowel or bladder incontinence to seek medical attention immediately.  Spent  25 minutes with patient face-to-face and had greater than 50% of counseling including as described above in assessment and plan.

## 2015-10-14 NOTE — Patient Instructions (Addendum)
Good to see you Willie Carter is your friend 2 injections today Prednisone daily for 5 days after today Xray downstairs and labs today.  Call me Monday if not better and we will get MRI Otherwise see me again in 10-14 days.

## 2015-10-14 NOTE — Progress Notes (Signed)
Pre visit review using our clinic review tool, if applicable. No additional management support is needed unless otherwise documented below in the visit note. 

## 2015-10-14 NOTE — Progress Notes (Signed)
  [name] [place], [place] [address] Phone: [phone] Subjective:     CC: Low back pain f/u  [id number] Willie Carter is a 29 y.o. male coming in with complaint of low back pain. History of moderate spinal stenosis. Patient's was started on gabapentin, home exercises, postural changes, and osteopathic manipulation. Patient on the course last 4 days has had an exacerbation. Having more pain that seems to be radiating down especially the right leg. Mild weakness. So painful that he cannot sit, weight down or stand without pain. Feels that his legs are fatigued at all times. No over-the-counter medications has been beneficial. Has tried even increase his gabapentin without any relief. Having difficulty even sleeping or do daily activities. Has not been able to work for the last 72 hours.   No past medical history on file. No past surgical history on file. Social History  Substance Use Topics  . Smoking status: Former Research scientist (life sciences)  . Smokeless tobacco: None  . Alcohol Use: No   No Known Allergies No family history on file.      Past medical history, social, surgical and family history all reviewed in electronic medical record.   Review of Systems: No headache, visual changes, nausea, vomiting, diarrhea, constipation, dizziness, abdominal pain, skin rash, fevers, chills, night sweats, weight loss, swollen lymph nodes, body aches, joint swelling, muscle aches, chest pain, shortness of breath, mood changes.   Objective Blood pressure 120/84, pulse 70, height 5\' 6"  (1.676 m), weight 218 lb (98.884 kg), SpO2 97 %.  General: No apparent distress alert and oriented x3 mood and affect normal, dressed appropriately.  HEENT: Pupils equal, extraocular movements intact  Respiratory: Patient's speak in full sentences and does not appear short of breath  Cardiovascular: No lower extremity edema, non tender, no erythema  Skin: Warm dry intact with  no signs of infection or rash on extremities or on axial skeleton.  Abdomen: Soft nontender  Neuro: Cranial nerves II through XII are intact, neurovascularly intact in all extremities with 2+ DTRs and 2+ pulses.  Lymph: No lymphadenopathy of posterior or anterior cervical chain or axillae bilaterally.  Gait normal with good balance and coordination.  MSK:  Non tender with full range of motion and good stability and symmetric strength and tone of  elbows, wrist, hip, knee and ankles bilaterally.    Back Exam:  Inspection: Continued poor core strength very uncomfortable sitting in chair. Motion: Flexion 25 deg, Extension 15 deg, Side Bending to 25 deg bilaterally,  Rotation to 25 deg bilaterally worsening range of motion than previously SLR laying: Positive right side XSLR laying: Negative  Palpable tenderness: Severely tender to palpation mostly over L4-L5 paraspinal musculature bilaterally right greater than left FABER: Continued tenderness on the right side and seems to be more severe Sensory change: Gross sensation intact to all lumbar and sacral dermatomes.  Reflexes: 2+ at both patellar tendons, 2+ at achilles tendons, Babinski's downgoing.  Strength at foot  Plantar-flexion: 5/5 Dorsi-flexion: 5/5 Eversion: 5/5 Inversion: 5/5  Leg strength  Quad: 5/5 Hamstring: 5/5 Hip flexor: 5/5 Hip abductors: 5/5  Gait unremarkable.       Impression and Recommendations:     This case required medical decision making of moderate complexity.

## 2015-10-17 ENCOUNTER — Ambulatory Visit: Payer: Self-pay | Admitting: Family Medicine

## 2015-10-17 ENCOUNTER — Telehealth: Payer: Self-pay | Admitting: Family Medicine

## 2015-10-17 DIAGNOSIS — Q7649 Other congenital malformations of spine, not associated with scoliosis: Secondary | ICD-10-CM

## 2015-10-17 MED ORDER — PREDNISONE 50 MG PO TABS: 50.0000 mg | ORAL_TABLET | Freq: Every day | ORAL | Status: DC

## 2015-10-17 NOTE — Telephone Encounter (Signed)
Willie Carter is calling to give a status update on the patient. She states that the pharmacy did not receive prednisone. She states that he is not as bad as when he came into the office, but is certainly still in pain. He feels pressure in his back and it will not allow him to sleep.

## 2015-10-17 NOTE — Telephone Encounter (Signed)
lmovm to let pt know prednisone was resent into his pharmacy.

## 2015-10-18 NOTE — Telephone Encounter (Signed)
Spoke to pt's wife this morning. They picked up the prednisone & they would like to go ahead & order an MRI.  Order entered.

## 2015-10-28 ENCOUNTER — Ambulatory Visit: Payer: Self-pay | Admitting: Family Medicine

## 2015-11-10 ENCOUNTER — Ambulatory Visit
Admission: RE | Admit: 2015-11-10 | Discharge: 2015-11-10 | Disposition: A | Discharge status: Home / Self Care | Payer: 59 | Source: Ambulatory Visit | Attending: Family Medicine | Admitting: Family Medicine

## 2015-11-10 DIAGNOSIS — Q7649 Other congenital malformations of spine, not associated with scoliosis: Secondary | ICD-10-CM

## 2015-11-25 ENCOUNTER — Encounter: Payer: Self-pay | Admitting: Family Medicine

## 2015-11-25 ENCOUNTER — Ambulatory Visit (INDEPENDENT_AMBULATORY_CARE_PROVIDER_SITE_OTHER): Payer: 59 | Admitting: Family Medicine

## 2015-11-25 VITALS — BP 118/82 | HR 77 | Ht 66.0 in | Wt 219.0 lb

## 2015-11-25 DIAGNOSIS — Q7649 Other congenital malformations of spine, not associated with scoliosis: Secondary | ICD-10-CM | POA: Diagnosis not present

## 2015-11-25 NOTE — Assessment & Plan Note (Signed)
Discussed with patient and family for long amount of time. We discussed that this could be something that will take time to fully healed. We discussed the possibility of using an epidural for diagnostic as well as possible therapeutic purposes. We also discussed formal physical therapy. Patient declined both of these with him improving at this time. Encourage core strengthening and continuing to watch his weight. We discussed icing regimen as well as what medications and take when he does have exacerbation. We discussed that the gabapentin is fine and if he needs muscle relaxer we can discuss. Patient does well he can follow up with me again in 4-6 weeks. Patient has any worsening symptoms he will call me and we will potentially order the epidural as we discussed previously.  Spent  25 minutes with patient face-to-face and had greater than 50% of counseling including as described above in assessment and plan.

## 2015-11-25 NOTE — Progress Notes (Signed)
  [name] [place], [place] [address] Phone: [phone] Subjective:     CC: Low back pain f/u  [id number] Willie Carter is a 29 y.o. male coming in with complaint of low back pain. History of moderate spinal stenosis. Patient was not responding to conservative therapy and was sent for an MRI. Patient's MRI was individually visualized and reviewed by me today. Patient's MRI showed patient still had some mild disc bulging at multiple levels and mild to moderate spinal stenosis but actually some improvement from previous exam. Patient's most concerning area was L4-L5. Patient states though that since he has changes diet and has become more active he has noticed some decreasing pain. Patient states that he is notices long as he is changes diet and avoiding significant heavy lifting he has been doing relatively well. No radiation down the legs. Not as much tightness in the legs either.   No past medical history on file. No past surgical history on file. Social History  Substance Use Topics  . Smoking status: Former Research scientist (life sciences)  . Smokeless tobacco: None  . Alcohol Use: No   No Known Allergies No family history on file. no family history that is pertinent to chief complaint      Past medical history, social, surgical and family history all reviewed in electronic medical record.   Review of Systems: No headache, visual changes, nausea, vomiting, diarrhea, constipation, dizziness, abdominal pain, skin rash, fevers, chills, night sweats, weight loss, swollen lymph nodes, body aches, joint swelling, muscle aches, chest pain, shortness of breath, mood changes.   Objective Blood pressure 118/82, pulse 77, height 5\' 6"  (1.676 m), weight 219 lb (99.338 kg), SpO2 96 %.  General: No apparent distress alert and oriented x3 mood and affect normal, dressed appropriately.  HEENT: Pupils equal, extraocular movements intact  Respiratory: Patient's speak  in full sentences and does not appear short of breath  Cardiovascular: No lower extremity edema, non tender, no erythema  Skin: Warm dry intact with no signs of infection or rash on extremities or on axial skeleton.  Abdomen: Soft nontender  Neuro: Cranial nerves II through XII are intact, neurovascularly intact in all extremities with 2+ DTRs and 2+ pulses.  Lymph: No lymphadenopathy of posterior or anterior cervical chain or axillae bilaterally.  Gait normal with good balance and coordination.  MSK:  Non tender with full range of motion and good stability and symmetric strength and tone of  elbows, wrist, hip, knee and ankles bilaterally.    Back Exam:  Inspection: Continued poor core strength very uncomfortable sitting in chair. Motion: Flexion 25 deg, Extension 20 deg, Side Bending to 25 deg bilaterally,  Rotation to 30 deg bilaterally mild improvement SLR laying: Negative XSLR laying: Negative  Palpable tenderness: S still mild discomfort over L3-L4 paraspinal musculature. FABER: Continued tightness bilaterally record of the left Sensory change: Gross sensation intact to all lumbar and sacral dermatomes.  Reflexes: 2+ at both patellar tendons, 2+ at achilles tendons, Babinski's downgoing.  Strength at foot  Plantar-flexion: 5/5 Dorsi-flexion: 5/5 Eversion: 5/5 Inversion: 5/5  Leg strength  Quad: 5/5 Hamstring: 5/5 Hip flexor: 5/5 Hip abductors: 5/5  Gait unremarkable.       Impression and Recommendations:     This case required medical decision making of moderate complexity.

## 2015-11-25 NOTE — Progress Notes (Signed)
Pre visit review using our clinic review tool, if applicable. No additional management support is needed unless otherwise documented below in the visit note. 

## 2016-05-21 IMAGING — MR MR LUMBAR SPINE W/O CM
5 series · 45 of 48 positions shown · non-contrast
Comparison: Lumbar MRI 02/27/2013

CLINICAL DATA: Congenital spinal stenosis. Low back pain with
bilateral leg pain

EXAM:
MRI LUMBAR SPINE WITHOUT CONTRAST
TECHNIQUE: Multiplanar, multisequence MR imaging of the lumbar spine was
performed. No intravenous contrast was administered.

[Series 3: tirm sag · sagittal · 4.0mm · 0.55mm/px · 6 of 13 slices shown]
[im 1/13]
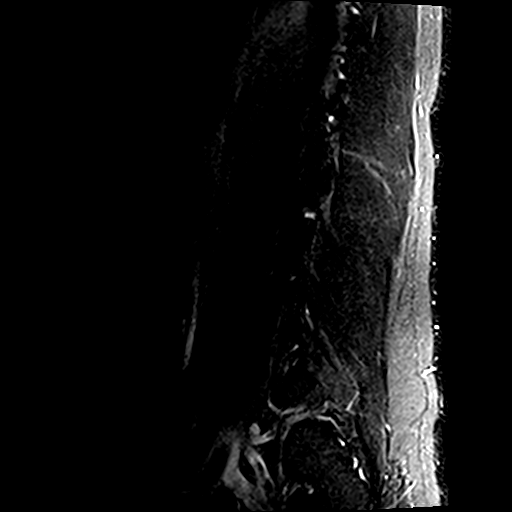
[im 3/13]
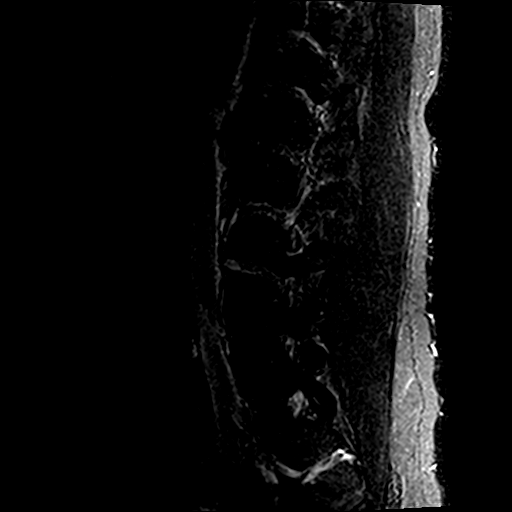
[im 5/13]
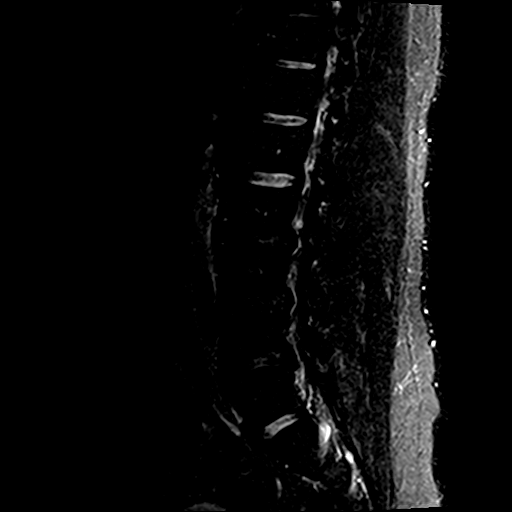
[im 8/13]
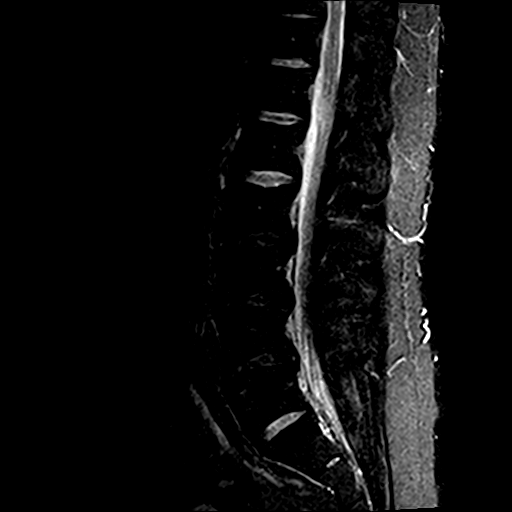
[im 10/13]
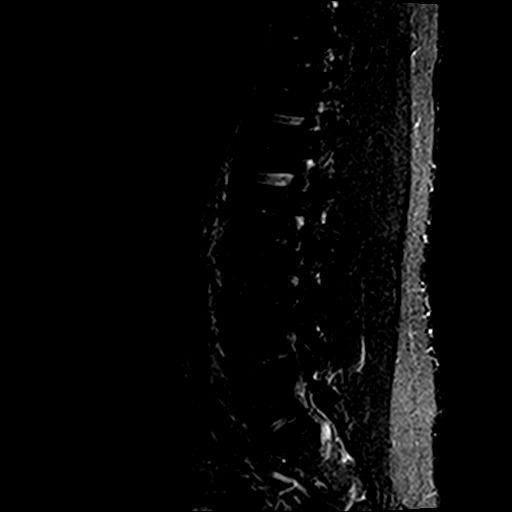
[im 13/13]
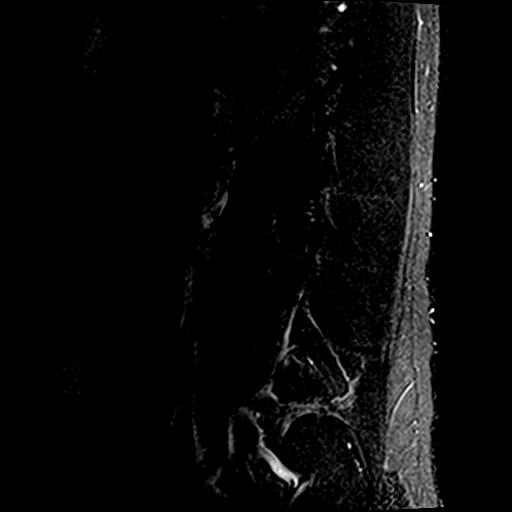

[Series 4: T2 · sagittal · 4.0mm · 0.88mm/px · 6 of 13 slices shown (1 of 2)]
[im 1/13]
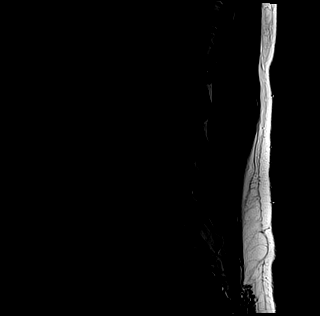
[im 3/13]
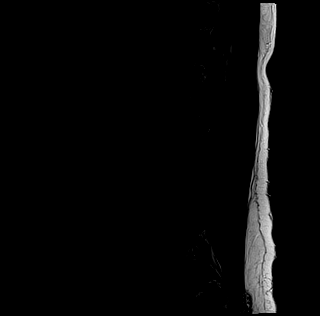
[im 5/13]
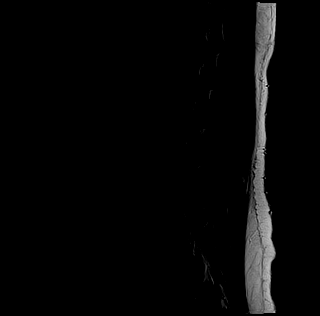
[im 8/13]
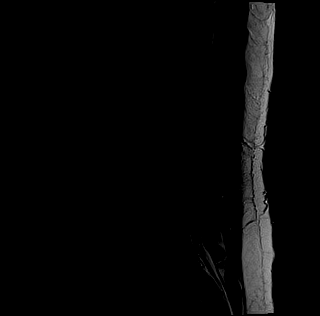
[im 10/13]
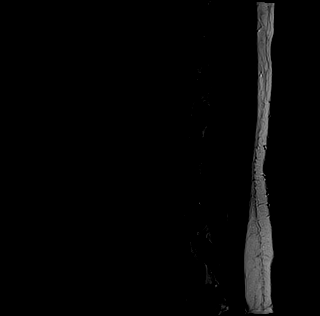
[im 13/13]
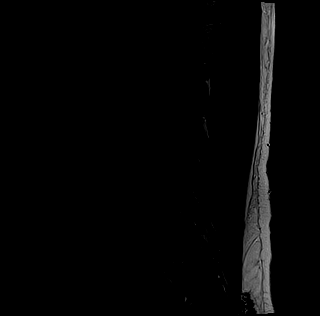

[Series 5: T1 · sagittal · 4.0mm · 0.88mm/px · 6 of 13 slices shown (1 of 2)]
[im 1/13]
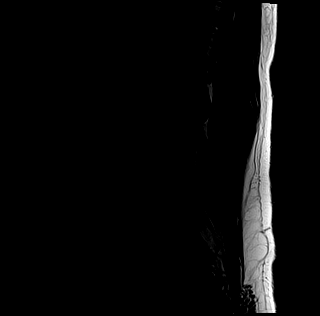
[im 3/13]
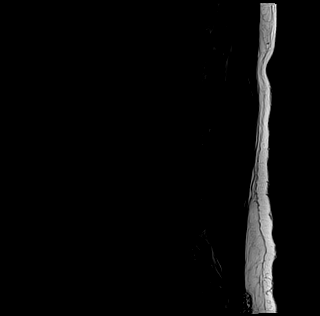
[im 5/13]
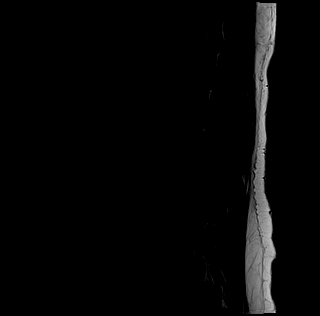
[im 8/13]
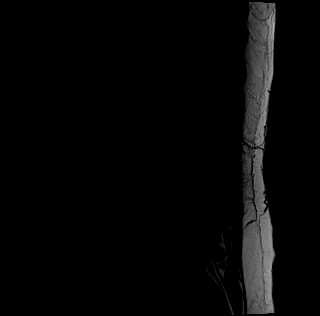
[im 10/13]
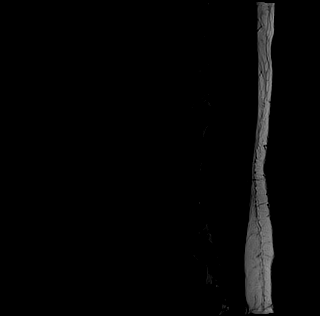
[im 13/13]
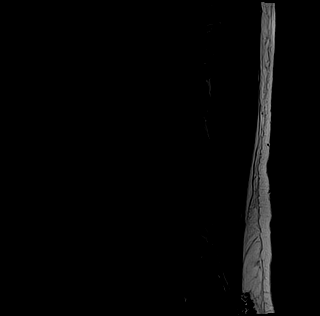

[Series 6: T1 · axial · 4.0mm · 0.70mm/px · z∈[-90,+85]mm · 12 of 33 slices shown (2 of 2)]
[im 1/33]
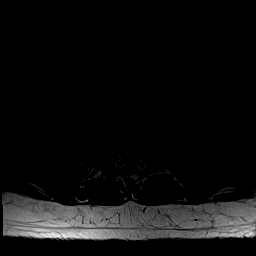
[im 3/33]
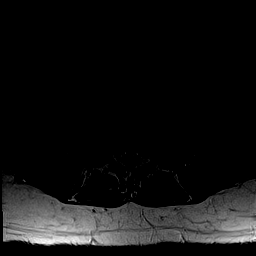
[im 5/33]
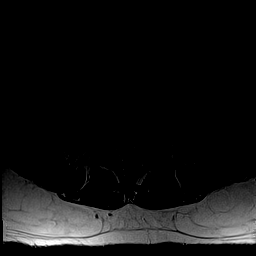
[im 7/33]
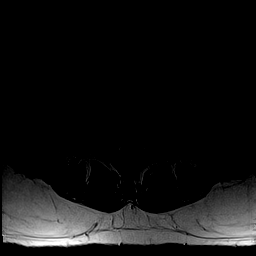
[im 10/33]
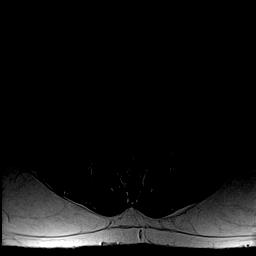
[im 12/33]
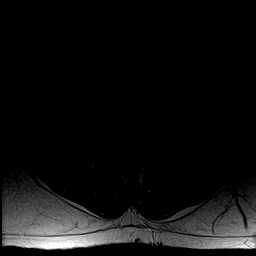
[im 14/33]
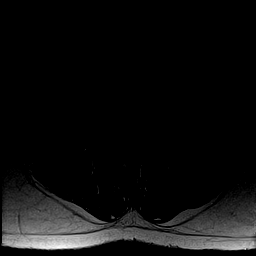
[im 17/33]
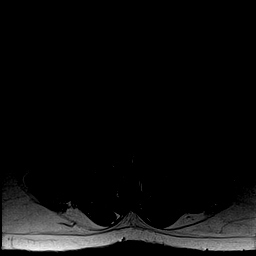
[im 19/33]
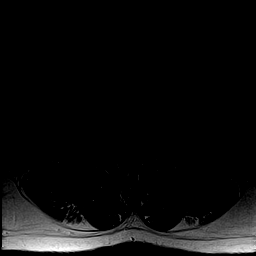
[im 23/33]
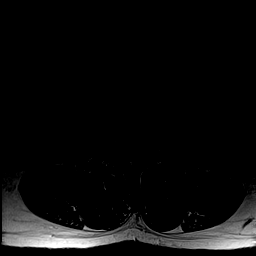
[im 28/33]
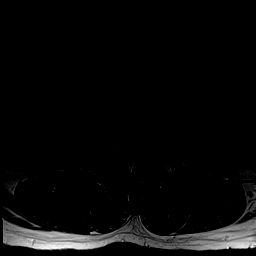
[im 33/33]
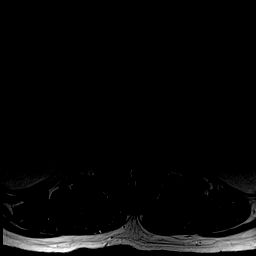

[Series 7: T2 · axial · 4.0mm · 0.70mm/px · z∈[-90,+85]mm · 15 of 33 slices shown (2 of 2)]
[im 1/33]
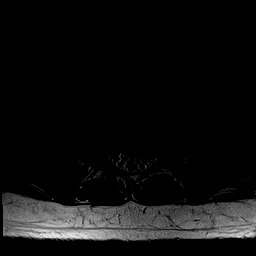
[im 3/33]
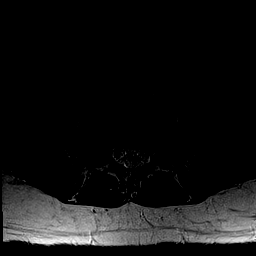
[im 5/33]
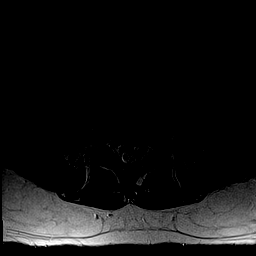
[im 7/33]
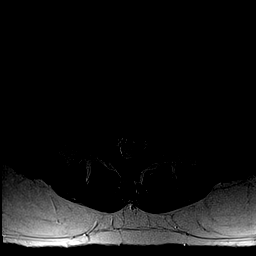
[im 10/33]
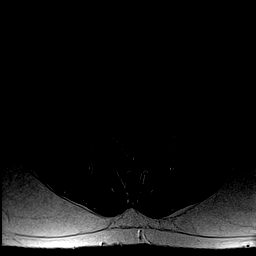
[im 12/33]
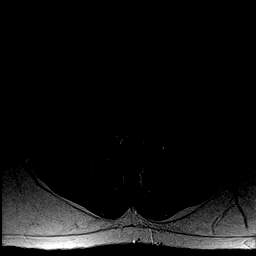
[im 14/33]
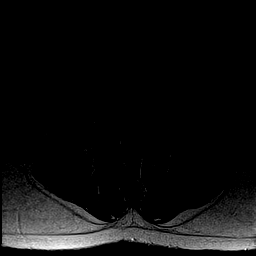
[im 17/33]
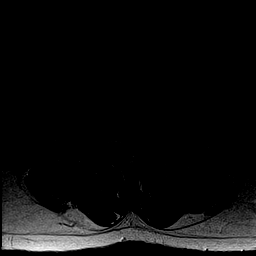
[im 19/33]
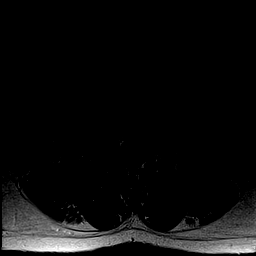
[im 21/33]
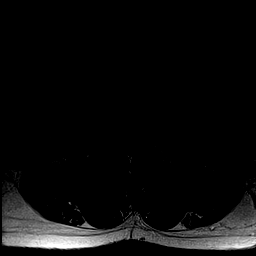
[im 23/33]
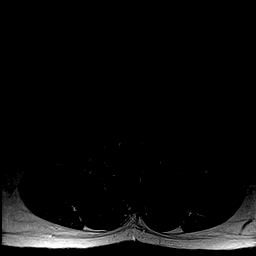
[im 26/33]
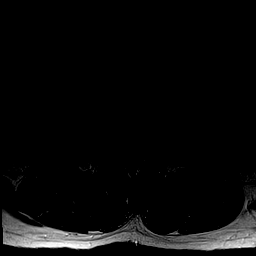
[im 28/33]
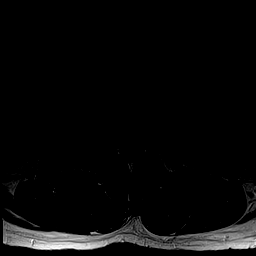
[im 30/33]
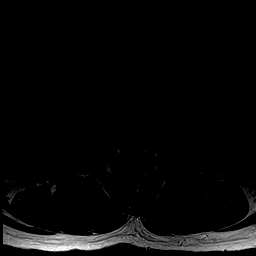
[im 33/33]
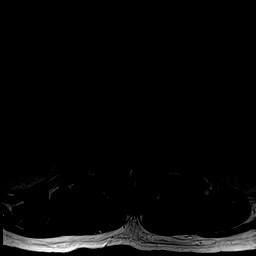

[45 of 48 positions shown; findings below may reference images not displayed]

FINDINGS: L5 is a transitional segment. Vertebral level assignment is
consistent with prior MRI.

Normal lumbar alignment. Negative for fracture or mass. Mild
congenital stenosis of the lumbar canal. Conus medullaris normal and
terminates mid L1

L1-2:  Negative

L2-3: Mild disc space narrowing and disc bulging. Small central disc
protrusion shows mild interval improvement. Bilateral facet
hypertrophy. Mild spinal stenosis.

L3-4: Interval improvement in central disc protrusion which is now
small. There is diffuse disc bulging and spurring. Bilateral facet
hypertrophy. Mild spinal stenosis. Mild subarticular stenosis on the
left. Improvement in spinal stenosis since the prior study.

L4-5: Central disc protrusion unchanged. Mild facet hypertrophy and
mild spinal stenosis. Neural foramina patent

L5-S1:  Negative
IMPRESSION: L2-3 mild spinal stenosis. Improvement in small central disc
protrusions since prior study

L3-4 mild spinal stenosis. Improvement in central disc protrusions
since prior study.

Central disc protrusion and mild spinal stenosis at L4-5 is
unchanged.

## 2016-11-06 ENCOUNTER — Emergency Department (HOSPITAL_COMMUNITY)
Admission: EM | Admit: 2016-11-06 | Discharge: 2016-11-06 | Disposition: A | Discharge status: Home / Self Care | Payer: Self-pay | Attending: Emergency Medicine | Admitting: Emergency Medicine

## 2016-11-06 ENCOUNTER — Encounter (HOSPITAL_COMMUNITY): Payer: Self-pay | Admitting: *Deleted

## 2016-11-06 DIAGNOSIS — M5442 Lumbago with sciatica, left side: Secondary | ICD-10-CM | POA: Insufficient documentation

## 2016-11-06 DIAGNOSIS — Z87891 Personal history of nicotine dependence: Secondary | ICD-10-CM | POA: Insufficient documentation

## 2016-11-06 MED ORDER — HYDROMORPHONE HCL 2 MG/ML IJ SOLN
2.0000 mg | Freq: Once | INTRAMUSCULAR | Status: AC
  Administered 2016-11-06: 2 mg via INTRAMUSCULAR
  Filled 2016-11-06: qty 1

## 2016-11-06 MED ORDER — OXYCODONE-ACETAMINOPHEN 5-325 MG PO TABS
1.0000 | ORAL_TABLET | Freq: Once | ORAL | Status: AC
  Administered 2016-11-06: 1 via ORAL
  Filled 2016-11-06: qty 1

## 2016-11-06 MED ORDER — OXYCODONE-ACETAMINOPHEN 5-325 MG PO TABS: 1.0000 | ORAL_TABLET | Freq: Four times a day (QID) | ORAL | 0 refills | Status: DC | PRN

## 2016-11-06 NOTE — Discharge Instructions (Signed)
Please schedule appointment with Dr. Tamala Julian tomorrow for follow-up. Please follow-up with primary care physician tomorrow. Please take 1-2 tablets of Percocet every 6 hours as needed for pain.  SOLICITE ATENCIN MDICA DE INMEDIATO SI: Siente debilidad o adormecimiento en una o ambas piernas, o en uno o ambos pies. Tiene dificultad para controlar la miccin o la defecacin. Tiene nuseas o vmitos. Siente dolor en el abdomen. Le falta el aire o se desmaya.

## 2016-11-06 NOTE — ED Provider Notes (Signed)
[place] [facility] DEPT Provider Note   CSN: [id number] Arrival date & time: 11/06/16  1356     History   Chief Complaint Chief Complaint  Patient presents with  . Back Pain    HPI Willie Carter is a 30 y.o. male with history of spinal stenosis presents with gradually worsening left-sided low back pain for 3 days. He states that today the pain increased significantly, sharp, and radiated down to his left leg, severity of 10 out of 10. He states he often gets low back pain usually on the right side and this time is on the left side. He states usually his back pain is related with lying down and rest. He states that this time lying down does not relieve his pain. He states that he had a back injury over 5 years ago. He has had 2 MRIs in the past which showed spinal stenosis. He reports tenderness to palpation. Patient denies changes in bowel movements, changes in urinary symptoms, IV drug use. Patient denies chest pain, shortness of breath, fevers, chills.    The history is provided by the patient. The history is limited by a language barrier.    History reviewed. No pertinent past medical history.  Patient Active Problem List   Diagnosis Date Noted  . Right shoulder pain 08/15/2015  . Low back pain 07/18/2015  . 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 Nonallopathic lesion of lumbosacral region 07/18/2015  . Nonallopathic lesion of sacral region 07/18/2015  . Nonallopathic lesion of pelvic region 07/18/2015    History reviewed. No pertinent surgical history.     Home Medications    Prior to Admission medications   Medication Sig Start Date End Date Taking? Authorizing Provider  acetaminophen (TYLENOL) 650 MG CR tablet Take 650 mg by mouth every 8 (eight) hours as needed for pain.   Yes Historical Provider, MD  gabapentin (NEURONTIN) 100 MG capsule Take 1 capsule (100 mg total) by mouth at bedtime. Patient not taking: Reported on 11/06/2016 07/18/15    Lyndal Pulley, DO  Ibuprofen-Famotidine 800-26.6 MG TABS Take 1 tablet 3 times daily as needed. Patient not taking: Reported on 11/06/2016 09/19/15   Lyndal Pulley, DO  OVER THE COUNTER MEDICATION Apply 1 application topically 3 (three) times daily as needed. "[place]"= muscle rub (spanish)    Historical Provider, MD  oxyCODONE-acetaminophen (PERCOCET/ROXICET) 5-325 MG tablet Take 1-2 tablets by mouth every 6 (six) hours as needed for severe pain. 11/06/16   Malillany Kazlauskas Manuel Halley, [place]  promethazine (PHENERGAN) 25 MG tablet Take 1 tablet (25 mg total) by mouth every 6 (six) hours as needed for nausea or vomiting. Patient not taking: Reported on 11/06/2016 01/06/14   Gregor Hams, MD    Family History History reviewed. No pertinent family history.  Social History Social History  Substance Use Topics  . Smoking status: Former Research scientist (life sciences)  . Smokeless tobacco: Never Used  . Alcohol use No     Allergies   Patient has no known allergies.   Review of Systems Review of Systems  Constitutional: Negative for chills, diaphoresis and fever.  Eyes: Negative for visual disturbance.  Respiratory: Negative for cough and shortness of breath.   Cardiovascular: Negative for chest pain and leg swelling.  Gastrointestinal: Negative for abdominal distention, abdominal pain, blood in stool, constipation, diarrhea, nausea and vomiting.  Genitourinary: Negative for difficulty urinating and flank pain.  Musculoskeletal: Positive for back pain. Negative for neck pain and neck stiffness.  Skin: Negative  for wound.  Neurological: Negative for dizziness and headaches.     Physical Exam Updated Vital Signs BP 99/73 (BP Location: Left Arm)   Pulse 61   Temp 97.9 F (36.6 C) (Oral)   Resp 18   SpO2 96%   Physical Exam  Constitutional: He is oriented to person, place, and time. He appears well-developed and well-nourished.  Patient uncomfortable  HENT:  Head: Normocephalic and atraumatic.  Nose:  Nose normal.  Mouth/Throat: Oropharynx is clear and moist.  Eyes: Conjunctivae and EOM are normal. Pupils are equal, round, and reactive to light.  Neck: Normal range of motion. Neck supple.  Cardiovascular: Normal rate, normal heart sounds and intact distal pulses.   No murmur heard. Pulmonary/Chest: Effort normal and breath sounds normal. No respiratory distress.  Abdominal: Soft. There is no tenderness.  Musculoskeletal: Normal range of motion. He exhibits tenderness (Left lower back. TTP most on left iliac spine than central spine. No TTP to right pelvis or thoracic and cervical spine. ). He exhibits no edema or deformity.  Neurological: He is alert and oriented to person, place, and time. No sensory deficit.  Cranial Nerves:  III,IV, VI: ptosis not present, extra-ocular movements intact bilaterally, direct and consensual pupillary light reflexes intact bilaterally V: facial sensation, jaw opening, and bite strength equal bilaterally VII: eyebrow raise, eyelid close, smile, frown, pucker equal bilaterally VIII: hearing grossly normal bilaterally  IX,X: palate elevation and swallowing intact XI: bilateral shoulder shrug and lateral head rotation equal and strong XII: midline tongue extension  Sensation and strength intact and equal bilaterally on upper extremities. Sensation intact bilaterally on lower extremities. Lower extremities difficult to test strength due to patient's condition.   Skin: Skin is warm. Capillary refill takes less than 2 seconds.  Psychiatric: He has a normal mood and affect. His behavior is normal.  Nursing note and vitals reviewed.    ED Treatments / Results  Labs (all labs ordered are listed, but only abnormal results are displayed) Labs Reviewed - No data to display  EKG  EKG Interpretation None       Radiology No results found.  Procedures Procedures (including critical care time)  Medications Ordered in ED Medications    oxyCODONE-acetaminophen (PERCOCET/ROXICET) 5-325 MG per tablet 1 tablet (1 tablet Oral Given 11/06/16 1615)  oxyCODONE-acetaminophen (PERCOCET/ROXICET) 5-325 MG per tablet 1 tablet (1 tablet Oral Given 11/06/16 1717)  HYDROmorphone (DILAUDID) injection 2 mg (2 mg Intramuscular Given 11/06/16 1840)     Initial Impression / Assessment and Plan / ED Course  I have reviewed the triage vital signs and the nursing notes.  Pertinent labs & imaging results that were available during my care of the patient were reviewed by me and considered in my medical decision making (see chart for details).  Clinical Course   Patient is a 30 yo male with a hx of spinal stenosis who presents to the ED with back pain. No neurological deficits appreciated. No warning symptoms of back pain including: fecal incontinence, urinary retention or overflow incontinence, night sweats, unexplained fevers or weight loss, h/o cancer, IVDU, recent trauma. No concern for cauda equina, epidural abscess, or other serious cause of back pain. Conservative measures such as rest, ice/heat and pain medicine indicated with PCP follow-up. Patient afebrile, hemodynamically stable and agreeable to discharge. Return precautions given for any new or worsening symptoms.   Final Clinical Impressions(s) / ED Diagnoses   Final diagnoses:  Acute left-sided low back pain with left-sided sciatica    New  Prescriptions Discharge Medication List as of 11/06/2016  7:32 PM    START taking these medications   Details  oxyCODONE-acetaminophen (PERCOCET/ROXICET) 5-325 MG tablet Take 1-2 tablets by mouth every 6 (six) hours as needed for severe pain., Starting Tue 11/06/2016, [place], [place] 11/06/16 2219    Malvin Johns, MD 11/07/16 0002

## 2016-11-06 NOTE — ED Triage Notes (Signed)
Patient is alert and oriented 4.  He is complaining of lower back pain that started 3 days ago.  This is a chronic issue.  Patient back pain is a result of a back injury over 5 years ago.  Currently he is rating his pain 10 of 10.

## 2016-11-06 NOTE — ED Notes (Signed)
Bed: [facility] Expected date:  Expected time:  Means of arrival:  Comments: EMS- lumbar pain for 5 years

## 2016-11-07 ENCOUNTER — Telehealth: Payer: Self-pay | Admitting: Family Medicine

## 2016-11-07 NOTE — Telephone Encounter (Signed)
Spoke to pt's wife. [facility] Friday @ 11am or w/ Dr. Tamala Julian 12.18.17 @ 815am. Pt decided to wait & see Dr. Tamala Julian on Monday. Pt scheduled.

## 2016-11-07 NOTE — Telephone Encounter (Signed)
States patient was in a lot of pain yesterday and had to go to [facility] ED.  States ED wanted patient to follow up with Dr. Tamala Julian.  Please follow up in regard to scheduling an appointment.

## 2016-11-07 NOTE — Telephone Encounter (Signed)
Have not seen in over a year. Patient declined all other treatment I had.  She can first available open slots.

## 2016-11-10 NOTE — Progress Notes (Signed)
[name] [address] [place], [place] [address] Phone: [phone] Subjective:     CC: Low back pain f/u  [id number]  Willie Carter is a 30 y.o. male coming in with complaint of low back pain. History of moderate spinal stenosis. Patient was not responding to conservative therapy and was sent for an MRI. Patient's MRI was individually visualized and reviewed by me today. Patient's MRI showed patient still had some mild disc bulging at multiple levels and mild to moderate spinal stenosis. Patient's most concerning area was L4-L5.  Patient was last seen one year ago. Patient was having worsening symptoms and when to the emergency department on December 12.patient was given pain medication patient states Continues to have pain at this time. Radiation down the posterior aspect the legs bilaterally. Patient states that when no weakness but unfortunately continues to have discomfort. Patient states it's affecting daily activities. Patient is supposed to be going out of the country for the holidays but is concerned that he will not be able to go secondary to the discomfort. Patient was given pain medications from the emergency room and does not feel exam made any significant improvement.    No past medical history on file. No past surgical history on file. Social History  Substance Use Topics  . Smoking status: Former Research scientist (life sciences)  . Smokeless tobacco: Never Used  . Alcohol use No   No Known Allergies no known drug allergies No family history on file. no family history that is pertinent to chief complaint      Past medical history, social, surgical and family history all reviewed in electronic medical record.   Review of Systems: No headache, visual changes, nausea, vomiting, diarrhea, constipation, dizziness, abdominal pain, skin rash, fevers, chills, night sweats, weight loss, swollen lymph nodes, chest pain, shortness of breath, mood changes.     Objective  Blood pressure 132/80, pulse 77, height 5\' 6"  (1.676 m), weight 213 lb (96.6 kg), SpO2 97 %.  General: No apparent distress alert and oriented x3 mood and affect normal, dressed appropriately.  HEENT: Pupils equal, extraocular movements intact  Respiratory: Patient's speak in full sentences and does not appear short of breath  Cardiovascular: No lower extremity edema, non tender, no erythema  Skin: Warm dry intact with no signs of infection or rash on extremities or on axial skeleton.  Abdomen: Soft nontender  Neuro: Cranial nerves II through XII are intact, neurovascularly intact in all extremities with 2+ DTRs and 2+ pulses.  Lymph: No lymphadenopathy of posterior or anterior cervical chain or axillae bilaterally.  Gait slow and cautious gait but normal MSK:  Non tender with full range of motion and good stability and symmetric strength and tone of  elbows, wrist, hip, knee and ankles bilaterally.    Back Exam:  Inspection: unremarkable Motion: Flexion 25 deg, Extension 15 deg, Side Bending to 25 deg bilaterally,  Rotation to 25 deg bilaterally  SLR laying: Positive left greater than right but bilateral XSLR laying: Negative  Palpable tenderness:  Increasing discomfort of the tenderness to palpation in the paraspinal musculature of the lumbar spine bilaterally FABER: Continued tightness bilaterally record of the left Sensory change: Gross sensation intact to all lumbar and sacral dermatomes.  Reflexes: 2+ at both patellar tendons, 2+ at achilles tendons, Babinski's downgoing.  Strength at foot  Plantar-flexion: 5/5 Dorsi-flexion: 5/5 Eversion: 5/5 Inversion: 5/5  Leg strength  Quad: 5/5 Hamstring: 5/5 Hip flexor: 5/5 Hip abductors: 4/5 weaker than previous  exam Gait unremarkable.      Impression and Recommendations:     This case required medical decision making of moderate complexity.

## 2016-11-12 ENCOUNTER — Encounter: Payer: Self-pay | Admitting: Family Medicine

## 2016-11-12 ENCOUNTER — Ambulatory Visit (INDEPENDENT_AMBULATORY_CARE_PROVIDER_SITE_OTHER): Payer: Self-pay | Admitting: Family Medicine

## 2016-11-12 VITALS — BP 132/80 | HR 77 | Ht 66.0 in | Wt 213.0 lb

## 2016-11-12 DIAGNOSIS — Q7649 Other congenital malformations of spine, not associated with scoliosis: Secondary | ICD-10-CM

## 2016-11-12 MED ORDER — PREDNISONE 50 MG PO TABS: 50.0000 mg | ORAL_TABLET | Freq: Every day | ORAL | 0 refills | Status: DC

## 2016-11-12 MED ORDER — GABAPENTIN 100 MG PO CAPS: 200.0000 mg | ORAL_CAPSULE | Freq: Every day | ORAL | 3 refills | Status: AC

## 2016-11-12 MED ORDER — METHYLPREDNISOLONE ACETATE 80 MG/ML IJ SUSP
80.0000 mg | Freq: Once | INTRAMUSCULAR | Status: AC
  Administered 2016-11-12: 80 mg via INTRAMUSCULAR

## 2016-11-12 MED ORDER — TRAMADOL HCL 50 MG PO TABS: 50.0000 mg | ORAL_TABLET | Freq: Two times a day (BID) | ORAL | 0 refills | Status: DC | PRN

## 2016-11-12 MED ORDER — KETOROLAC TROMETHAMINE 60 MG/2ML IM SOLN
60.0000 mg | Freq: Once | INTRAMUSCULAR | Status: AC
  Administered 2016-11-12: 60 mg via INTRAMUSCULAR

## 2016-11-12 NOTE — Assessment & Plan Note (Signed)
Worsening symptoms. I believe the patient is having an exacerbation at this time. Patient is going to start on gabapentin as well as prednisone. Patient also given tramadol for any breaks or pain. Patient I think would respond very well to an epidural injection as well as this could be helpful for diagnostic. This was ordered today as well. We discussed icing regimen. Toradol injection into the medical given today as well. Follow-up again in 2 weeks after the epidural. Worsening symptoms patient notes a cystic medical attention immediately.

## 2016-11-12 NOTE — Patient Instructions (Signed)
Good to see you.  Ice 20 minutes 2 times daily. Usually after activity and before bed. 2 injections today  We will order the epidural.  Prednisone daily for 7 days Gabapentin 200mg  at night Can take tramadol up to 2 times a day for pain  See me again 2 weeks after the epidural  Happy holidays!

## 2017-02-18 ENCOUNTER — Other Ambulatory Visit: Payer: Self-pay

## 2017-02-18 ENCOUNTER — Encounter: Payer: Self-pay | Admitting: Family Medicine

## 2017-02-18 ENCOUNTER — Ambulatory Visit (INDEPENDENT_AMBULATORY_CARE_PROVIDER_SITE_OTHER): Payer: Self-pay | Admitting: Family Medicine

## 2017-02-18 VITALS — BP 122/82 | HR 94 | Ht 66.0 in | Wt 214.6 lb

## 2017-02-18 DIAGNOSIS — Q7649 Other congenital malformations of spine, not associated with scoliosis: Secondary | ICD-10-CM

## 2017-02-18 DIAGNOSIS — G8929 Other chronic pain: Secondary | ICD-10-CM

## 2017-02-18 DIAGNOSIS — M545 Low back pain: Secondary | ICD-10-CM | POA: Diagnosis not present

## 2017-02-18 DIAGNOSIS — M48061 Spinal stenosis, lumbar region without neurogenic claudication: Secondary | ICD-10-CM

## 2017-02-18 MED ORDER — METHYLPREDNISOLONE ACETATE 80 MG/ML IJ SUSP
80.0000 mg | Freq: Once | INTRAMUSCULAR | Status: AC
  Administered 2017-02-18: 80 mg via INTRAMUSCULAR

## 2017-02-18 MED ORDER — VENLAFAXINE HCL ER 37.5 MG PO CP24: 37.5000 mg | ORAL_CAPSULE | Freq: Every day | ORAL | 1 refills | Status: AC

## 2017-02-18 MED ORDER — KETOROLAC TROMETHAMINE 60 MG/2ML IM SOLN
60.0000 mg | Freq: Once | INTRAMUSCULAR | Status: AC
  Administered 2017-02-18: 60 mg via INTRAMUSCULAR

## 2017-02-18 NOTE — Assessment & Plan Note (Signed)
Worsening symptoms. Patient does have what appears to be more of a congenital stenosis noted. Seem to be mild to moderate previously on imaging. Patient is having worsening symptoms with weakness as well as radicular symptoms that seems to be chronic. Started on Effexor to see if this will be beneficial with him having no significant improvement previously. We discussed the potential for manipulation again the patient has only had some mild improvement for this previously. Patient's has elected try the epidural that was ordered for him previously but was unable to get it secondary to insurance problems. Patient will have this done and we discussed that this will be hopefully therapeutic as well as diagnostic. This will follow-up with me again in 2 weeks afterwards.

## 2017-02-18 NOTE — Progress Notes (Signed)
[name] [place], [place] [address] Phone: [phone] Subjective:    I'm seeing this patient by the request  of:    CC: Neck and back pain.  [id number]  Willie Carter is a 31 y.o. male coming in with complaint of neck and back pain. Patient does have known history of spinal stenosis with advance imaging. Patient has responded previously to prednisone, gabapentin, tramadol as well as home exercises. Patient was having some worsening symptoms back in 2016. Patient was to get an epidural but never did so. Patient states he is having worsening pain again. Patient has not been seen for greater than a year secondary to insurance problems. Patient states in the last 5-6 months he has had significant amount of pain with radicular symptoms going down both legs left greater then right. States that it is affecting daily activities. Unable to play with his kids, patient states it is waking him up at night. Has had a call out of work multiple times as well.     No past medical history on file. No past surgical history on file. Social History   Social History  . Marital status: Married    Spouse name: N/A  . Number of children: N/A  . Years of education: N/A   Social History Main Topics  . Smoking status: Former Research scientist (life sciences)  . Smokeless tobacco: Never Used  . Alcohol use No  . Drug use: No  . Sexual activity: Not Asked   Other Topics Concern  . None   Social History Narrative  . None   No Known Allergies No family history on file. No family history rheumatological diseases.  Past medical history, social, surgical and family history all reviewed in electronic medical record.  No pertanent information unless stated regarding to the chief complaint.   Review of Systems:Review of systems updated and as accurate as of 02/18/17  No headache, visual changes, nausea, vomiting, diarrhea, constipation, dizziness, abdominal pain, skin rash,  fevers, chills, night sweats, weight loss, swollen lymph nodes, body aches, joint swelling, muscle aches, chest pain, shortness of breath, mood changes.   Objective  Blood pressure 122/82, pulse 94, height 5\' 6"  (1.676 m), weight 214 lb 9.6 oz (97.3 kg), SpO2 96 %. Systems examined below as of 02/18/17   General: No apparent distress alert and oriented x3 mood and affect normal, dressed appropriately.  HEENT: Pupils equal, extraocular movements intact  Respiratory: Patient's speak in full sentences and does not appear short of breath  Cardiovascular: No lower extremity edema, non tender, no erythema  Skin: Warm dry intact with no signs of infection or rash on extremities or on axial skeleton.  Abdomen: Soft nontender  Neuro: Cranial nerves II through XII are intact, neurovascularly intact in all extremities with 2+ DTRs and 2+ pulses.  Lymph: No lymphadenopathy of posterior or anterior cervical chain or axillae bilaterally.  Gait normal with good balance and coordination stiff hamstrings noted MSK:  Non tender with full range of motion and good stability and symmetric strength and tone of shoulders, elbows, wrist, hip, knee and ankles bilaterally.  Back Exam:  Inspection: Unremarkable  Motion: Flexion 35 deg, Extension 15 deg with worsening pain, Side Bending to 25 deg bilaterally, Rotation to 25 deg bilaterally  SLR laying: Positive XSLR laying: Negative  Palpable tenderness: Increasing tenderness to palpation over the L4-L5 vertebrae left couldn't and right.Marland Kitchen FABER: Tightness bilaterally left couldn't and right and severe tightness of the hamstring on  the left side Sensory change: Gross sensation intact to all lumbar and sacral dermatomes.  Reflexes: 2+ at both patellar tendons, 2+ at achilles tendons, Babinski's downgoing.  Strength at foot  4 out of 5 on the left side with plantarflexion compared to full strength on the right side.    Impression and Recommendations:     This case  required medical decision making of moderate complexity.      Note: This dictation was prepared with Dragon dictation along with smaller phrase technology. Any transcriptional errors that result from this process are unintentional.

## 2017-02-18 NOTE — Patient Instructions (Addendum)
Good to see you  Alvera Singh is your friend.  We gave you 2 injections today  We will get the epidural ordered.  We will also start effexor 1 pill daily  See me again in 2-3 weeks after the epidural and lets see how you did.

## 2017-02-21 ENCOUNTER — Ambulatory Visit
Admission: RE | Admit: 2017-02-21 | Discharge: 2017-02-21 | Disposition: A | Discharge status: Home / Self Care | Payer: BLUE CROSS/BLUE SHIELD | Source: Ambulatory Visit | Attending: Family Medicine | Admitting: Family Medicine

## 2017-02-21 DIAGNOSIS — M48061 Spinal stenosis, lumbar region without neurogenic claudication: Secondary | ICD-10-CM

## 2017-02-21 MED ORDER — METHYLPREDNISOLONE ACETATE 40 MG/ML INJ SUSP (RADIOLOG
120.0000 mg | Freq: Once | INTRAMUSCULAR | Status: AC
  Administered 2017-02-21: 120 mg via EPIDURAL

## 2017-02-21 MED ORDER — IOPAMIDOL (ISOVUE-M 200) INJECTION 41%
1.0000 mL | Freq: Once | INTRAMUSCULAR | Status: AC
  Administered 2017-02-21: 1 mL via EPIDURAL

## 2017-02-21 NOTE — Discharge Instructions (Signed)

## 2017-03-27 ENCOUNTER — Other Ambulatory Visit: Payer: Self-pay

## 2017-03-27 ENCOUNTER — Ambulatory Visit (INDEPENDENT_AMBULATORY_CARE_PROVIDER_SITE_OTHER): Payer: Self-pay | Admitting: Family Medicine

## 2017-03-27 ENCOUNTER — Encounter: Payer: Self-pay | Admitting: Family Medicine

## 2017-03-27 DIAGNOSIS — M5416 Radiculopathy, lumbar region: Secondary | ICD-10-CM

## 2017-03-27 DIAGNOSIS — Q7649 Other congenital malformations of spine, not associated with scoliosis: Secondary | ICD-10-CM

## 2017-03-27 NOTE — Patient Instructions (Signed)
Good to see you  I am sorry it was temporary but good news is it worked for a while and we know this is the area We will repeat the epidural again and hope for much longer results Send me a message or call me 2 weeks after the injection and tell me how you are doing.

## 2017-03-27 NOTE — Assessment & Plan Note (Signed)
Patient did respond fairly well to epidural but it only lasted approximately 3 weeks. We will repeat the injection again hoping for greater duration of symptom improvement. Patient was to avoid any type of surgical intervention. We discussed other medications or increasing gabapentin which patient declined. Patient will start taking the gabapentin on a more regular basis. We discussed the icing regimen. Declined any type of injections today.  Spent  25 minutes with patient face-to-face and had greater than 50% of counseling including as described above in assessment and plan.

## 2017-03-27 NOTE — Progress Notes (Signed)
[name] [place],  [address] Phone: [phone] Subjective:    I'm seeing this patient by the request  of:    CC: Neck and back pain f/u   [id number]  Willie Carter is a 31 y.o. male coming in with complaint of neck and back pain. Patient does have known history of spinal stenosis with advance imaging. Patient has responded previously to prednisone, gabapentin, tramadol as well as home exercises. Patient was seen by me previously. Patient did in the spinal stenosis and did get a epidural. Patient states that in 3 weeks of being completely pain-free. Patient was very happy. Over the course last week so we said having severe amount of pain again. Same pain as what he had previously. Worsening symptoms at this time. Patient states that he had call at work twice secondary to the discomfort. Patient states that unable to follow sleep at night as well.     History reviewed. No pertinent past medical history. History reviewed. No pertinent surgical history. Social History   Social History  . Marital status: Married    Spouse name: N/A  . Number of children: N/A  . Years of education: N/A   Social History Main Topics  . Smoking status: Former Research scientist (life sciences)  . Smokeless tobacco: Never Used  . Alcohol use No  . Drug use: No  . Sexual activity: Not Asked   Other Topics Concern  . None   Social History Narrative  . None   No Known Allergies History reviewed. No pertinent family history. No family history rheumatological diseases.  Past medical history, social, surgical and family history all reviewed in electronic medical record.  No pertanent information unless stated regarding to the chief complaint.   Review of Systems:Review of systems updated and as accurate as of 03/27/17  No headache, visual changes, nausea, vomiting, diarrhea, constipation, dizziness, abdominal pain, skin rash, fevers, chills, night sweats, weight loss,  swollen lymph nodes, body aches, joint swelling, muscle aches, chest pain, shortness of breath, mood changes.   Objective  Blood pressure (!) 140/102, pulse 63, resp. rate 16, weight 217 lb (98.4 kg), SpO2 97 %.   Systems examined below as of 03/27/17 General: NAD A&O x3 mood, affect normal  HEENT: Pupils equal, extraocular movements intact no nystagmus Respiratory: not short of breath at rest or with speaking Cardiovascular: No lower extremity edema, non tender Skin: Warm dry intact with no signs of infection or rash on extremities or on axial skeleton. Abdomen: Soft nontender, no masses Neuro: Cranial nerves  intact, neurovascularly intact in all extremities with 2+ DTRs and 2+ pulses. Lymph: No lymphadenopathy appreciated today  Gait normal with good balance and coordination.  MSK: Non tender with full range of motion and good stability and symmetric strength and tone of shoulders, elbows, wrist,  knee hips and ankles bilaterally.   Back Exam:  Inspection: Unremarkable  Motion: Flexion 40 deg worsening pain after 20, Extension 20 with worsening extension Side Bending to 25 deg bilaterally, Rotation to 25 deg bilaterally  SLR laying: Positive XSLR laying: Negative  Palpable tenderness: Severe tenderness to palpation of the L4-L5 region... FABER: Positive right Sensory change: Gross sensation intact to all lumbar and sacral dermatomes.  Reflexes: 2+ at both patellar tendons, 2+ at achilles tendons, Babinski's downgoing.  Strength at foot  4 out of 5 on the left side with plantarflexion compared to full strength on the right side. No increased weakness from previous exam  Impression and Recommendations:     This case required medical decision making of moderate complexity.      Note: This dictation was prepared with Dragon dictation along with smaller phrase technology. Any transcriptional errors that result from this process are unintentional.

## 2017-03-27 NOTE — Progress Notes (Signed)
Pre-visit discussion using our clinic review tool. No additional management support is needed unless otherwise documented below in the visit note.  

## 2017-04-01 ENCOUNTER — Ambulatory Visit
Admission: RE | Admit: 2017-04-01 | Discharge: 2017-04-01 | Disposition: A | Discharge status: Home / Self Care | Payer: BLUE CROSS/BLUE SHIELD | Source: Ambulatory Visit | Attending: Family Medicine | Admitting: Family Medicine

## 2017-04-01 DIAGNOSIS — M5416 Radiculopathy, lumbar region: Secondary | ICD-10-CM

## 2017-04-01 MED ORDER — METHYLPREDNISOLONE ACETATE 40 MG/ML INJ SUSP (RADIOLOG
120.0000 mg | Freq: Once | INTRAMUSCULAR | Status: AC
  Administered 2017-04-01: 120 mg via EPIDURAL

## 2017-04-01 MED ORDER — IOPAMIDOL (ISOVUE-M 200) INJECTION 41%
1.0000 mL | Freq: Once | INTRAMUSCULAR | Status: AC
  Administered 2017-04-01: 1 mL via EPIDURAL

## 2017-04-01 NOTE — Discharge Instructions (Signed)

## 2017-04-03 ENCOUNTER — Other Ambulatory Visit: Payer: 59

## 2017-05-07 ENCOUNTER — Telehealth: Payer: Self-pay | Admitting: Pediatrics

## 2017-05-07 DIAGNOSIS — Q7649 Other congenital malformations of spine, not associated with scoliosis: Secondary | ICD-10-CM

## 2017-05-07 NOTE — Telephone Encounter (Signed)
Pt called in said that he would like nurse to call him. He said that Dr Tamala Julian told him to call

## 2017-05-08 NOTE — Telephone Encounter (Signed)
Spoke with pt, per dr Tamala Julian the next step is to discuss with neurosurgery. Referral entered.

## 2017-05-08 NOTE — Telephone Encounter (Signed)
Pt called in again, he states he is supposed to talk to [facility] regarding his epidural he had 3 weeks ago to see how he is doing

## 2017-05-20 ENCOUNTER — Telehealth: Payer: Self-pay | Admitting: Pediatrics

## 2017-05-20 ENCOUNTER — Telehealth: Payer: Self-pay | Admitting: Family Medicine

## 2017-05-20 NOTE — Telephone Encounter (Signed)
Patient states he called our office to get a number for [facility].  States he called their office and they stated they had not received faxed referral.  Patient is requesting referral to be sent again.  Please follow up with patient once fax has been sent.

## 2017-05-20 NOTE — Telephone Encounter (Signed)
Pt called checking on the status of the referral. I gave him [facility]'s information and he said he would contact them to make an appointment. He stated that he is in a lot of pain.

## 2017-05-20 NOTE — Telephone Encounter (Signed)
Error

## 2017-05-20 NOTE — Telephone Encounter (Signed)
Referral refaxed to [facility]. They will contact pt to schedule appt.

## 2017-05-23 ENCOUNTER — Telehealth: Payer: Self-pay | Admitting: Pediatrics

## 2017-05-23 NOTE — Telephone Encounter (Signed)
Pt is having lower back pain, he would like a call back in regard

## 2017-05-23 NOTE — Telephone Encounter (Signed)
lmovm for pt to return call.  

## 2017-06-03 ENCOUNTER — Telehealth: Payer: Self-pay

## 2017-06-03 DIAGNOSIS — Q7649 Other congenital malformations of spine, not associated with scoliosis: Secondary | ICD-10-CM

## 2017-06-03 MED ORDER — TRAMADOL HCL 50 MG PO TABS: 50.0000 mg | ORAL_TABLET | Freq: Two times a day (BID) | ORAL | 0 refills | Status: AC | PRN

## 2017-06-03 NOTE — Telephone Encounter (Signed)
Spoke to pt, he was seen by [facility] today. Pt does not feel comfortable after seeing them & would like a second opinion. Per dr Tamala Julian, okay to refer to Dr. Lynann Bologna @ [facility] send in a rx for Tramadol.

## 2017-06-03 NOTE — Telephone Encounter (Signed)
Pt is requesting a call back.   RE: referral to neurosurgery. Pt stated that there was a situation with the neurosurgeon today and is rq pain medication.

## 2017-06-07 ENCOUNTER — Other Ambulatory Visit: Payer: Self-pay | Admitting: Neurosurgery

## 2017-06-07 DIAGNOSIS — M5441 Lumbago with sciatica, right side: Secondary | ICD-10-CM

## 2017-06-09 ENCOUNTER — Emergency Department (HOSPITAL_COMMUNITY): Payer: BLUE CROSS/BLUE SHIELD

## 2017-06-09 ENCOUNTER — Emergency Department (HOSPITAL_COMMUNITY)
Admission: EM | Admit: 2017-06-09 | Discharge: 2017-06-09 | Disposition: A | Discharge status: Home / Self Care | Payer: BLUE CROSS/BLUE SHIELD | Attending: Emergency Medicine | Admitting: Emergency Medicine

## 2017-06-09 ENCOUNTER — Encounter (HOSPITAL_COMMUNITY): Payer: Self-pay

## 2017-06-09 DIAGNOSIS — Z79899 Other long term (current) drug therapy: Secondary | ICD-10-CM | POA: Insufficient documentation

## 2017-06-09 DIAGNOSIS — R0789 Other chest pain: Secondary | ICD-10-CM | POA: Diagnosis not present

## 2017-06-09 DIAGNOSIS — Z87891 Personal history of nicotine dependence: Secondary | ICD-10-CM | POA: Insufficient documentation

## 2017-06-09 DIAGNOSIS — D45 Polycythemia vera: Secondary | ICD-10-CM | POA: Diagnosis not present

## 2017-06-09 DIAGNOSIS — D751 Secondary polycythemia: Secondary | ICD-10-CM

## 2017-06-09 DIAGNOSIS — R079 Chest pain, unspecified: Secondary | ICD-10-CM | POA: Diagnosis present

## 2017-06-09 HISTORY — DX: Dorsalgia, unspecified: M54.9

## 2017-06-09 LAB — BASIC METABOLIC PANEL
ANION GAP: 11 (ref 5–15)
BUN: 12 mg/dL (ref 6–20)
CO2: 23 mmol/L (ref 22–32)
CREATININE: 0.83 mg/dL (ref 0.61–1.24)
Calcium: 10.4 mg/dL — ABNORMAL HIGH (ref 8.9–10.3)
Chloride: 105 mmol/L (ref 101–111)
GLUCOSE: 91 mg/dL (ref 65–99)
Potassium: 4.5 mmol/L (ref 3.5–5.1)
Sodium: 139 mmol/L (ref 135–145)

## 2017-06-09 LAB — CBC
HCT: 54.2 % — ABNORMAL HIGH (ref 39.0–52.0)
HEMOGLOBIN: 19 g/dL — AB (ref 13.0–17.0)
MCH: 32.2 pg (ref 26.0–34.0)
MCHC: 35.1 g/dL (ref 30.0–36.0)
MCV: 91.9 fL (ref 78.0–100.0)
Platelets: 239 10*3/uL (ref 150–400)
RBC: 5.9 MIL/uL — AB (ref 4.22–5.81)
RDW: 13.2 % (ref 11.5–15.5)
WBC: 13.7 10*3/uL — ABNORMAL HIGH (ref 4.0–10.5)

## 2017-06-09 NOTE — ED Provider Notes (Signed)
[facility] DEPT Provider Note   CSN: [id number] Arrival date & time: 06/09/17  2044     History   Chief Complaint Chief Complaint  Patient presents with  . Chest Pain    HPI Willie Carter is a 31 y.o. male.  HPI  31 year old male who presents today complaining of lower anterior chest pain that began today while he was at the park. He denies any trauma. His with his daughters. At this pressure and sharp in the lower sternal area. He denies anything that makes it better or worse. Denies any dyspnea, fever, cough, nausea, vomiting. He has not had any similar symptoms. He denies any family history of coronary artery disease.  Past Medical History:  Diagnosis Date  . Back pain     Patient Active Problem List   Diagnosis Date Noted  . Right shoulder pain 08/15/2015  . Low back pain 07/18/2015  . 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 Nonallopathic lesion of lumbosacral region 07/18/2015  . Nonallopathic lesion of sacral region 07/18/2015  . Nonallopathic lesion of pelvic region 07/18/2015    History reviewed. No pertinent surgical history.     Home Medications    Prior to Admission medications   Medication Sig Start Date End Date Taking? Authorizing Provider  gabapentin (NEURONTIN) 100 MG capsule Take 2 capsules (200 mg total) by mouth at bedtime. 11/12/16   Lyndal Pulley, DO  OVER THE COUNTER MEDICATION Apply 1 application topically 3 (three) times daily as needed. "[place]"= muscle rub (spanish)    [provider]  traMADol (ULTRAM) 50 MG tablet Take 1 tablet (50 mg total) by mouth every 12 (twelve) hours as needed. 06/03/17   Lyndal Pulley, DO  venlafaxine XR (EFFEXOR XR) 37.5 MG 24 hr capsule Take 1 capsule (37.5 mg total) by mouth daily with breakfast. 02/18/17   Lyndal Pulley, DO    Family History History reviewed. No pertinent family history.  Social History Social History  Substance Use Topics  . Smoking status:  Former Research scientist (life sciences)  . Smokeless tobacco: Never Used  . Alcohol use No     Allergies   Patient has no known allergies.   Review of Systems Review of Systems  All other systems reviewed and are negative.    Physical Exam Updated Vital Signs BP 114/75   Pulse 81   Temp 98.6 F (37 C) (Oral)   Resp 16   SpO2 96%   Physical Exam  Constitutional: He is oriented to person, place, and time. He appears well-developed.  HENT:  Head: Normocephalic and atraumatic.  Right Ear: External ear normal.  Left Ear: External ear normal.  Nose: Nose normal.  Eyes: EOM are normal.  Neck: No tracheal deviation present.  Cardiovascular: Normal rate, regular rhythm and normal heart sounds.   Pulmonary/Chest: Effort normal. He exhibits tenderness.    Musculoskeletal: Normal range of motion.  Neurological: He is alert and oriented to person, place, and time.  Skin: Skin is warm and dry.  Psychiatric: He has a normal mood and affect. His behavior is normal.  Nursing note and vitals reviewed.    ED Treatments / Results  Labs (all labs ordered are listed, but only abnormal results are displayed) Labs Reviewed  CBC - Abnormal; Notable for the following:       Result Value   WBC 13.7 (*)    RBC 5.90 (*)    Hemoglobin 19.0 (*)    HCT 54.2 (*)  All other components within normal limits  BASIC METABOLIC PANEL  I-STAT TROPOININ, ED    EKG  EKG Interpretation  Date/Time:  Sunday June 09 2017 20:50:48 EDT Ventricular Rate:  83 PR Interval:  146 QRS Duration: 88 QT Interval:  328 QTC Calculation: 385 R Axis:   -27 Text Interpretation:  Normal sinus rhythm Poor R wave progression Abnormal ECG Confirmed by Pattricia Boss [phone]) on 06/09/2017 10:06:52 PM       Radiology Dg Chest 2 View  Result Date: 06/09/2017 CLINICAL DATA:  Chest pain beginning today. EXAM: CHEST  2 VIEW COMPARISON:  None FINDINGS: The heart size is exaggerated by low lung volumes. There is no edema or effusion. No  significant airspace consolidation is present. The visualized soft tissues and bony thorax are unremarkable. IMPRESSION: Low lung volumes. No acute cardiopulmonary disease. Electronically Signed   By: San Morelle M.D.   On: 06/09/2017 21:18    Procedures Procedures (including critical care time)  Medications Ordered in ED Medications - No data to display   Initial Impression / Assessment and Plan / ED Course  I have reviewed the triage vital signs and the nursing notes.  Pertinent labs & imaging results that were available during my care of the patient were reviewed by me and considered in my medical decision making (see chart for details).     Electrolytes normal Troponin normal Hemoglobin elevated at 19. Discussed with patient possible Blaine depletion and he denies smoking. Patient advised to have this rechecked.  Final Clinical Impressions(s) / ED Diagnoses   Final diagnoses:  Chest wall pain  Polycythemia    New Prescriptions New Prescriptions   No medications on file     Pattricia Boss, MD 06/09/17 2239

## 2017-06-09 NOTE — ED Notes (Signed)
I-stat troponin 0.00 

## 2017-06-09 NOTE — ED Notes (Signed)
Patient transported to X-ray 

## 2017-06-09 NOTE — Discharge Instructions (Signed)
Recheck with your doctor this week. Your blood count (hemoglobin) is high. Drink plenty of fluids.  Do not smoke.

## 2017-06-09 NOTE — ED Triage Notes (Signed)
Onset 3 hours PTA chest pressure.  Nothing makes better or worse.  No other s/s noted.

## 2017-06-10 LAB — I-STAT TROPONIN, ED: Troponin i, poc: 0 ng/mL (ref 0.00–0.08)

## 2017-11-02 ENCOUNTER — Emergency Department (HOSPITAL_COMMUNITY)
Admission: EM | Admit: 2017-11-02 | Discharge: 2017-11-02 | Disposition: A | Discharge status: Home / Self Care | Payer: BLUE CROSS/BLUE SHIELD | Attending: Emergency Medicine | Admitting: Emergency Medicine

## 2017-11-02 ENCOUNTER — Encounter (HOSPITAL_COMMUNITY): Payer: Self-pay | Admitting: Emergency Medicine

## 2017-11-02 DIAGNOSIS — M545 Low back pain: Secondary | ICD-10-CM | POA: Insufficient documentation

## 2017-11-02 DIAGNOSIS — G8929 Other chronic pain: Secondary | ICD-10-CM | POA: Insufficient documentation

## 2017-11-02 DIAGNOSIS — Z87891 Personal history of nicotine dependence: Secondary | ICD-10-CM | POA: Insufficient documentation

## 2017-11-02 DIAGNOSIS — Q7649 Other congenital malformations of spine, not associated with scoliosis: Secondary | ICD-10-CM | POA: Insufficient documentation

## 2017-11-02 MED ORDER — DEXAMETHASONE SODIUM PHOSPHATE 4 MG/ML IJ SOLN
8.0000 mg | Freq: Once | INTRAMUSCULAR | Status: AC
  Administered 2017-11-02: 8 mg via INTRAMUSCULAR
  Filled 2017-11-02: qty 2

## 2017-11-02 MED ORDER — CYCLOBENZAPRINE HCL 10 MG PO TABS: 10.0000 mg | ORAL_TABLET | Freq: Two times a day (BID) | ORAL | 0 refills | Status: AC | PRN

## 2017-11-02 MED ORDER — CYCLOBENZAPRINE HCL 10 MG PO TABS
5.0000 mg | ORAL_TABLET | Freq: Once | ORAL | Status: AC
Start: 2017-11-02 — End: 2017-11-02
  Administered 2017-11-02: 5 mg via ORAL
  Filled 2017-11-02: qty 1

## 2017-11-02 MED ORDER — MELOXICAM 7.5 MG PO TABS: 7.5000 mg | ORAL_TABLET | Freq: Every day | ORAL | 0 refills | Status: AC

## 2017-11-02 MED ORDER — KETOROLAC TROMETHAMINE 30 MG/ML IJ SOLN
30.0000 mg | Freq: Once | INTRAMUSCULAR | Status: AC
  Administered 2017-11-02: 30 mg via INTRAMUSCULAR
  Filled 2017-11-02: qty 1

## 2017-11-02 NOTE — ED Triage Notes (Signed)
Patient here via EMS from home with complaints of lower chronic back pain. Worse x3 days. Non radiating. Ambulatory.

## 2017-11-02 NOTE — ED Provider Notes (Signed)
[facility] DEPT Provider Note   CSN: [id number] Arrival date & time: 11/02/17  1857     History   Chief Complaint Chief Complaint  Patient presents with  . Back Pain    HPI Willie Carter is a 31 y.o. male with past medical history of chronic lower back pain, presented to the ED with worsening symptoms times 1 year.  Patient states he has been treating his symptoms with Tylenol with relief of symptoms.  States today he had more pain with standing up and called EMS at that time.  Localizes pain to bilateral lower back and into left buttock with pain radiating down posterior thigh. No recent injuries or fall. Denies numbness or tingling, bowel or bladder incontinence, fever, history of IV drug use or history of cancer. Reports he has seen a neurosurgeon for his back issues, who did not recommend any surgical intervention.  He states he has been receiving injections, with the last one in August, however they did not provide relief. The history is provided by the patient.    Past Medical History:  Diagnosis Date  . Back pain     Patient Active Problem List   Diagnosis Date Noted  . Right shoulder pain 08/15/2015  . Low back pain 07/18/2015  . 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 Nonallopathic lesion of lumbosacral region 07/18/2015  . Nonallopathic lesion of sacral region 07/18/2015  . Nonallopathic lesion of pelvic region 07/18/2015    History reviewed. No pertinent surgical history.     Home Medications    Prior to Admission medications   Medication Sig Start Date End Date Taking? Authorizing Provider  gabapentin (NEURONTIN) 100 MG capsule Take 2 capsules (200 mg total) by mouth at bedtime. Patient not taking: Reported on 06/09/2017 11/12/16   Lyndal Pulley, DO  traMADol (ULTRAM) 50 MG tablet Take 1 tablet (50 mg total) by mouth every 12 (twelve) hours as needed. 06/03/17   Lyndal Pulley, DO  venlafaxine XR  (EFFEXOR XR) 37.5 MG 24 hr capsule Take 1 capsule (37.5 mg total) by mouth daily with breakfast. Patient not taking: Reported on 06/09/2017 02/18/17   Lyndal Pulley, DO    Family History No family history on file.  Social History Social History   Tobacco Use  . Smoking status: Former Research scientist (life sciences)  . Smokeless tobacco: Never Used  Substance Use Topics  . Alcohol use: No  . Drug use: No     Allergies   Patient has no known allergies.   Review of Systems Review of Systems  Constitutional: Negative for chills and fever.  Gastrointestinal:       Bowel incontinence  Genitourinary: Negative for difficulty urinating.  Musculoskeletal: Positive for back pain.  Neurological: Negative for weakness and numbness.  All other systems reviewed and are negative.    Physical Exam Updated Vital Signs BP (!) 150/97 (BP Location: Right Arm)   Pulse (!) 57   Temp 97.9 F (36.6 C) (Oral)   Resp 15   Ht 5\' 6"  (1.676 m)   Wt 97.5 kg (215 lb)   SpO2 99%   BMI 34.70 kg/m   Physical Exam  Constitutional: He appears well-developed and well-nourished. No distress.  Appears uncomfortable, laying on side.  HENT:  Head: Normocephalic and atraumatic.  Eyes: Conjunctivae are normal.  Neck: Normal range of motion.  Cardiovascular: Normal rate and intact distal pulses.  Pulmonary/Chest: Effort normal and breath sounds normal.  Abdominal: Soft. Bowel sounds  are normal. There is no tenderness.  Musculoskeletal:  No spinal or paraspinal tenderness, no bony step-offs, no gross deformities.  Tenderness located in left gluteal region.   Neurological: He is alert.  Motor:  Normal tone. 5/5 in lower extremities bilaterally including strong and equal strength dorsiflexion/plantar flexion Sensory: Pinprick and light touch normal in BLE  Deep Tendon Reflexes: 2+ and symmetric in patella Gait: normal gait and balance CV: distal pulses palpable throughout    Skin: Skin is warm.  Psychiatric: He has a  normal mood and affect. His behavior is normal.  Nursing note and vitals reviewed.    ED Treatments / Results  Labs (all labs ordered are listed, but only abnormal results are displayed) Labs Reviewed - No data to display  EKG  EKG Interpretation None       Radiology No results found.  Procedures Procedures (including critical care time)  Medications Ordered in ED Medications  dexamethasone (DECADRON) injection 8 mg (not administered)  ketorolac (TORADOL) 30 MG/ML injection 30 mg (not administered)  cyclobenzaprine (FLEXERIL) tablet 5 mg (not administered)     Initial Impression / Assessment and Plan / ED Course  I have reviewed the triage vital signs and the nursing notes.  Pertinent labs & imaging results that were available during my care of the patient were reviewed by me and considered in my medical decision making (see chart for details).    Patient with back pain.  No neurological deficits and normal neuro exam.  Patient can walk but states is painful.  No loss of bowel or bladder control.  No concern for cauda equina.  No fever, night sweats, weight loss, h/o cancer, IVDU.  RICE protocol and pain medicine indicated and discussed with patient. Pt instructed to follow up with orthopedic specialist.  Discussed results, findings, treatment and follow up. Patient advised of return precautions. Patient verbalized understanding and agreed with plan.  Final Clinical Impressions(s) / ED Diagnoses   Final diagnoses:  Chronic bilateral low back pain, with sciatica presence unspecified    ED Discharge Orders    None       Robinson, [place] N, PA-C 11/02/17 2016    Jola Schmidt, MD 11/02/17 2320

## 2017-11-02 NOTE — Discharge Instructions (Signed)
Please read instructions below.  You can take meloxicam/mobic once daily for pain.  You can take flexeril up to 2 times daily as needed for muscle spasm. Apply ice to your back for 20 minutes at a time. You can also apply heat if that provides you with  more relief. Follow up with your back doctor. Return to ER if new numbness or tingling in your arms or legs, inability to urinate, inability to hold your bowels, or weakness in your extremities.

## 2018-02-21 ENCOUNTER — Other Ambulatory Visit: Payer: Self-pay | Admitting: Orthopaedic Surgery

## 2018-02-21 DIAGNOSIS — M5136 Other intervertebral disc degeneration, lumbar region: Secondary | ICD-10-CM

## 2018-03-03 ENCOUNTER — Ambulatory Visit
Admission: RE | Admit: 2018-03-03 | Discharge: 2018-03-03 | Disposition: A | Discharge status: Home / Self Care | Payer: BLUE CROSS/BLUE SHIELD | Source: Ambulatory Visit | Attending: Orthopaedic Surgery | Admitting: Orthopaedic Surgery

## 2018-03-03 DIAGNOSIS — M5136 Other intervertebral disc degeneration, lumbar region: Secondary | ICD-10-CM

## 2019-06-19 ENCOUNTER — Other Ambulatory Visit: Payer: Self-pay

## 2019-06-19 DIAGNOSIS — Z20822 Contact with and (suspected) exposure to covid-19: Secondary | ICD-10-CM

## 2019-06-22 LAB — NOVEL CORONAVIRUS, NAA: SARS-CoV-2, NAA: NOT DETECTED

## 2019-10-20 ENCOUNTER — Other Ambulatory Visit: Payer: Self-pay

## 2019-10-20 DIAGNOSIS — Z20822 Contact with and (suspected) exposure to covid-19: Secondary | ICD-10-CM

## 2019-10-22 LAB — NOVEL CORONAVIRUS, NAA: SARS-CoV-2, NAA: DETECTED — AB

## 2019-10-30 ENCOUNTER — Other Ambulatory Visit: Payer: Self-pay

## 2019-10-30 DIAGNOSIS — Z20822 Contact with and (suspected) exposure to covid-19: Secondary | ICD-10-CM

## 2019-11-02 LAB — NOVEL CORONAVIRUS, NAA: SARS-CoV-2, NAA: DETECTED — AB

## 2019-11-03 ENCOUNTER — Encounter: Payer: Self-pay | Admitting: *Deleted

## 2019-11-04 ENCOUNTER — Other Ambulatory Visit: Payer: Self-pay

## 2019-11-04 DIAGNOSIS — Z20822 Contact with and (suspected) exposure to covid-19: Secondary | ICD-10-CM

## 2019-11-05 LAB — NOVEL CORONAVIRUS, NAA: SARS-CoV-2, NAA: NOT DETECTED

## 2020-09-24 ENCOUNTER — Ambulatory Visit: Payer: Self-pay | Attending: Internal Medicine

## 2020-09-24 DIAGNOSIS — Z23 Encounter for immunization: Secondary | ICD-10-CM

## 2022-12-13 ENCOUNTER — Telehealth (HOSPITAL_COMMUNITY): Payer: Self-pay

## 2022-12-14 NOTE — Progress Notes (Signed)
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  We have discussed the following details about hemochromatosis. HH is most commonly due to mutations in [facility] gene, is a disorder in which intestinal iron absorption can lead to to total body iron overload.HFE mutations are quite common; however, not all individuals with HFE mutations develop iron overload.  Mutations in the HFE gene are seen in the vast majority of individuals with HH. Two common HFE mutations are commonly observed  ?C282Y - Guanine to adenine change at nucleotide 845 in the HFE gene that causes substitution of cysteine for tyrosine at amino acid 282 ?H63D - Cytosine to guanine change at nucleotide 187 in the HFE gene that causes substitution of histidine to aspartic acid at amino acid 63  Not all individuals with HFE mutations develop iron overload and clinical HH. Other genetic and/or environmental factors, as well as other medical conditions, dietary iron intake, and blood loss (eg, from physiologic bleeding such as menstruation or pathologic bleeding) likely play a role in the manifestation of clinically significant body iron burden.   Chronic fatigue, skin hyperpigmentation, swelling of joints, joint stiffness happened to be the most common symptoms.  Occasionally we can see an abnormal liver function test, bronze hyperpigmentation, diabetes, important to maintain electrocardiographic abnormalities which is uncommon in the current era.  Management in patients with iron overload include phlebotomy if patient cannot tolerate phlebotomy and we could consider iron chelator.  If patients do not have iron overload, they can be  monitored.  We will repeat CBC, CMP, iron panel and ferritin, hemochromatosis gene mutation analysis today.  I have encouraged him to consider blood donation on a regular basis.  If he does have ferritin greater than 500 on labs today, we can also consider doing therapeutic phlebotomy.  All his questions were answered to the best my knowledge.  Thank you for consulting [place] in the care of this patient.  Please do not hesitate to contact us with any additional questions or concerns. HISTORY OF PRESENTING ILLNESS:  Willie Carter 37 y.o. male is here because of Hemochromatosis.  This is a very pleasant 37 year old male patient with no significant past medical history except for back pain who recently went for his physical exam and was found to have elevated liver function test, had a FibroScan which showed nonalcoholic fatty liver disease and was referred to hematology for hemochromatosis. Patient at baseline has by her back pain which she controls with as needed muscle relaxants and pain medication.  He works in [employer] and is physically very active.  He has also lost 25 pounds in the past several months intentionally and his back pain has been much better controlled since the weight loss.  He denies any known family history of hemochromatosis or need for phlebotomies.  He does not drink alcohol on a regular basis.  He quit smoking.  He denies any other joint pains, skin discoloration, gonadal dysfunction.  Rest of the pertinent 10 point ROS reviewed and negative  REVIEW OF SYSTEMS:   Constitutional: Denies fevers, chills or abnormal night sweats Eyes: Denies blurriness of vision, double vision or watery eyes Ears, nose, mouth, throat, and face: Denies mucositis or sore throat Respiratory: Denies cough, dyspnea or wheezes  Cardiovascular: Denies palpitation, chest discomfort or lower extremity swelling Gastrointestinal:  Denies nausea, heartburn or change in bowel habits Skin: Denies  abnormal skin rashes Lymphatics: Denies new lymphadenopathy or easy bruising Neurological:Denies numbness, tingling or new weaknesses Behavioral/Psych: Mood is stable, no new changes  All other systems were reviewed with the patient and are negative.  MEDICAL HISTORY:  Past Medical History:  Diagnosis Date   Back pain     SURGICAL HISTORY: No past surgical history on file.  SOCIAL HISTORY: Social History   Socioeconomic History   Marital status: Married    Spouse name: Not on file   Number of children: Not on file   Years of education: Not on file   Highest education level: Not on file  Occupational History   Not on file  Tobacco Use   Smoking status: Former   Smokeless tobacco: Never  Substance and Sexual Activity   Alcohol use: No   Drug use: No   Sexual activity: Not on file  Other Topics Concern   Not on file  Social History Narrative   Not on file   Social Determinants of Health   Financial Resource Strain: Not on file  Food Insecurity: Not on file  Transportation Needs: Not on file  Physical Activity: Not on file  Stress: Not on file  Social Connections: Not on file  Intimate Partner Violence: Not on file    FAMILY HISTORY: No family history on file.  ALLERGIES:  has No Known Allergies.  MEDICATIONS:  Current Outpatient Medications  Medication Sig Dispense Refill   cyclobenzaprine (FLEXERIL) 10 MG tablet Take 1 tablet (10 mg total) by mouth 2 (two) times daily as needed for muscle spasms. 20 tablet 0   gabapentin (NEURONTIN) 100 MG capsule Take 2 capsules (200 mg total) by mouth at bedtime. (Patient not taking: Reported on 06/09/2017) 60 capsule 3   meloxicam (MOBIC) 7.5 MG tablet Take 1 tablet (7.5 mg total) by mouth daily. 15 tablet 0   traMADol (ULTRAM) 50 MG tablet Take 1 tablet (50 mg total) by mouth every 12 (twelve) hours as needed. 30 tablet 0   venlafaxine XR (EFFEXOR XR) 37.5 MG 24 hr capsule Take 1 capsule (37.5 mg total) by mouth daily  with breakfast. (Patient not taking: Reported on 06/09/2017) 30 capsule 1   No current facility-administered medications for this visit.     PHYSICAL EXAMINATION: ECOG PERFORMANCE STATUS: 0 - Asymptomatic  Vitals:   12/15/22 0809  BP: (!) 122/90  Pulse: 63  Resp: 15  Temp: 98 F (36.7 C)  SpO2: 100%   Filed Weights   12/15/22 0809  Weight: 204 lb 9.6 oz (92.8 kg)    GENERAL:alert, no distress and comfortable SKIN: skin color, texture, turgor are normal, no rashes or significant lesions EYES: normal, conjunctiva are pink and non-injected, sclera clear OROPHARYNX:no exudate, no erythema and lips, buccal mucosa, and tongue normal  NECK: supple, thyroid normal size, non-tender, without nodularity LYMPH:  no palpable lymphadenopathy in the cervical, axillary LUNGS: clear to auscultation and percussion with normal breathing effort HEART: regular rate & rhythm and no murmurs and no lower extremity edema ABDOMEN:abdomen soft, non-tender and normal bowel sounds Musculoskeletal:no cyanosis of digits and no clubbing  PSYCH: alert & oriented x 3 with fluent speech NEURO: no focal motor/sensory deficits  LABORATORY DATA:  I have reviewed the data as listed Lab Results  Component Value Date   WBC 13.7 (H) 06/09/2017   HGB 19.0 (H) 06/09/2017   HCT  54.2 (H) 06/09/2017   MCV 91.9 06/09/2017   PLT 239 06/09/2017     Chemistry      Component Value Date/Time   NA 139 06/09/2017 2105   K 4.5 06/09/2017 2105   CL 105 06/09/2017 2105   CO2 23 06/09/2017 2105   BUN 12 06/09/2017 2105   CREATININE 0.83 06/09/2017 2105      Component Value Date/Time   CALCIUM 10.4 (H) 06/09/2017 2105     CBC showed Hb 18.2 Fibro scan showed steatosis.  RADIOGRAPHIC STUDIES: I have personally reviewed the radiological images as listed and agreed with the findings in the report. No results found.  All questions were answered. The patient knows to call the clinic with any problems, questions or  concerns. I spent 30 minutes in the care of this patient including H and P, review of records, counseling and coordination of care.     Benay Pike, MD 12/15/2022 8:27 AM

## 2022-12-15 ENCOUNTER — Inpatient Hospital Stay: Payer: Commercial Managed Care - HMO | Attending: Hematology and Oncology | Admitting: Hematology and Oncology

## 2022-12-15 ENCOUNTER — Encounter: Payer: Self-pay | Admitting: Hematology and Oncology

## 2022-12-15 ENCOUNTER — Inpatient Hospital Stay: Payer: Commercial Managed Care - HMO

## 2022-12-15 DIAGNOSIS — Z87891 Personal history of nicotine dependence: Secondary | ICD-10-CM | POA: Insufficient documentation

## 2022-12-15 DIAGNOSIS — R5382 Chronic fatigue, unspecified: Secondary | ICD-10-CM | POA: Insufficient documentation

## 2022-12-15 DIAGNOSIS — L819 Disorder of pigmentation, unspecified: Secondary | ICD-10-CM | POA: Insufficient documentation

## 2022-12-15 DIAGNOSIS — K760 Fatty (change of) liver, not elsewhere classified: Secondary | ICD-10-CM | POA: Diagnosis not present

## 2022-12-15 DIAGNOSIS — Z79899 Other long term (current) drug therapy: Secondary | ICD-10-CM | POA: Insufficient documentation

## 2022-12-15 LAB — CMP (CANCER CENTER ONLY)
ALT: 64 U/L — ABNORMAL HIGH (ref 0–44)
AST: 22 U/L (ref 15–41)
Albumin: 4.2 g/dL (ref 3.5–5.0)
Alkaline Phosphatase: 48 U/L (ref 38–126)
Anion gap: 8 (ref 5–15)
BUN: 18 mg/dL (ref 6–20)
CO2: 21 mmol/L — ABNORMAL LOW (ref 22–32)
Calcium: 10 mg/dL (ref 8.9–10.3)
Chloride: 109 mmol/L (ref 98–111)
Creatinine: 0.89 mg/dL (ref 0.61–1.24)
GFR, Estimated: >60 mL/min (ref >60)
Glucose, Bld: 102 mg/dL — ABNORMAL HIGH (ref 70–99)
Potassium: 4 mmol/L (ref 3.5–5.1)
Sodium: 138 mmol/L (ref 135–145)
Total Bilirubin: 0.3 mg/dL (ref 0.3–1.2)
Total Protein: 7.3 g/dL (ref 6.5–8.1)

## 2022-12-15 LAB — CBC WITH DIFFERENTIAL/PLATELET
Abs Immature Granulocytes: 0.03 10*3/uL (ref 0.00–0.07)
Basophils Absolute: 0.1 10*3/uL (ref 0.0–0.1)
Basophils Relative: 1 %
Eosinophils Absolute: 0.2 10*3/uL (ref 0.0–0.5)
Eosinophils Relative: 3 %
HCT: 50 % (ref 39.0–52.0)
Hemoglobin: 17.8 g/dL — ABNORMAL HIGH (ref 13.0–17.0)
Immature Granulocytes: 0 %
Lymphocytes Relative: 39 %
Lymphs Abs: 3.5 10*3/uL (ref 0.7–4.0)
MCH: 31.6 pg (ref 26.0–34.0)
MCHC: 35.6 g/dL (ref 30.0–36.0)
MCV: 88.7 fL (ref 80.0–100.0)
Monocytes Absolute: 0.4 10*3/uL (ref 0.1–1.0)
Monocytes Relative: 5 %
Neutro Abs: 4.6 10*3/uL (ref 1.7–7.7)
Neutrophils Relative %: 52 %
Platelets: 261 10*3/uL (ref 150–400)
RBC: 5.64 MIL/uL (ref 4.22–5.81)
RDW: 13.3 % (ref 11.5–15.5)
WBC: 8.9 10*3/uL (ref 4.0–10.5)
nRBC: 0 % (ref 0.0–0.2)

## 2022-12-15 LAB — FERRITIN: Ferritin: 401 ng/mL — ABNORMAL HIGH (ref 24–336)

## 2022-12-15 LAB — IRON AND IRON BINDING CAPACITY (CC-WL,HP ONLY)
Iron: 96 ug/dL (ref 45–182)
Saturation Ratios: 37 % (ref 17.9–39.5)
TIBC: 258 ug/dL (ref 250–450)
UIBC: 162 ug/dL (ref 117–376)

## 2022-12-15 NOTE — ED Notes (Signed)
Opened chart to clarify where pt is to go for appointment.

## 2022-12-17 ENCOUNTER — Telehealth: Payer: Self-pay | Admitting: *Deleted

## 2022-12-17 NOTE — Telephone Encounter (Signed)
PC to patient to discuss lab results & Dr Rob Hickman recommendations, no answer, left VM for patient to call this office, [phone].

## 2022-12-24 LAB — HEMOCHROMATOSIS DNA-PCR(C282Y,H63D)

## 2022-12-31 ENCOUNTER — Inpatient Hospital Stay: Payer: Commercial Managed Care - HMO | Attending: Hematology and Oncology | Admitting: Hematology and Oncology

## 2022-12-31 ENCOUNTER — Encounter: Payer: Self-pay | Admitting: Hematology and Oncology

## 2022-12-31 ENCOUNTER — Other Ambulatory Visit: Payer: Self-pay

## 2022-12-31 DIAGNOSIS — Z87891 Personal history of nicotine dependence: Secondary | ICD-10-CM | POA: Diagnosis not present

## 2022-12-31 DIAGNOSIS — Z79899 Other long term (current) drug therapy: Secondary | ICD-10-CM | POA: Insufficient documentation

## 2022-12-31 NOTE — Progress Notes (Signed)
[facility] CONSULT NOTE  Patient Care Team: Hulan Saas, MD (Inactive) as PCP - General (Pediatrics)  CHIEF COMPLAINTS/PURPOSE OF CONSULTATION:  Hemochromatosis carrier  ASSESSMENT & PLAN:   This is a pleasant 37 year old male patient with past medical history significant for back pain referred to hematology for hemochromatosis.  We have discussed his lab results which showed heterozygous gene mutation for hemochromatosis. Given his mildly elevated ALT, we have discussed about considering 4 sessions of therapeutic phlebotomy followed by considering blood donation once every 2 to 3 months.  We have discussed about avoiding alcohol, Tylenol, iron rich foods and red meat on a regular basis.  He was encouraged to discuss this with his family members. He appreciates understanding of all the recommendations.  I will plan to see him back in 3 to 4 months with repeat labs.  HISTORY OF PRESENTING ILLNESS:  Willie Carter 37 y.o. male is here because of Hemochromatosis.  This is a very pleasant 37 year old male patient with no significant past medical history except for back pain who recently went for his physical exam and was found to have elevated liver function test, had a FibroScan which showed nonalcoholic fatty liver disease and was referred to hematology for hemochromatosis.  Interval History  He is here for follow up. He denies any new health complaints. He continues to work on losing his weight.  He does not drink on a routine basis. He has not gotten a chance to donate blood since his last visit here.  Rest of the pertinent 10 point ROS reviewed and negative  REVIEW OF SYSTEMS:   Constitutional: Denies fevers, chills or abnormal night sweats Eyes: Denies blurriness of vision, double vision or watery eyes Ears, nose, mouth, throat, and face: Denies mucositis or sore throat Respiratory: Denies cough, dyspnea or wheezes Cardiovascular: Denies palpitation, chest  discomfort or lower extremity swelling Gastrointestinal:  Denies nausea, heartburn or change in bowel habits Skin: Denies abnormal skin rashes Lymphatics: Denies new lymphadenopathy or easy bruising Neurological:Denies numbness, tingling or new weaknesses Behavioral/Psych: Mood is stable, no new changes  All other systems were reviewed with the patient and are negative.  MEDICAL HISTORY:  Past Medical History:  Diagnosis Date   Back pain     SURGICAL HISTORY: No past surgical history on file.  SOCIAL HISTORY: Social History   Socioeconomic History   Marital status: Married    Spouse name: Not on file   Number of children: Not on file   Years of education: Not on file   Highest education level: Not on file  Occupational History   Not on file  Tobacco Use   Smoking status: Former   Smokeless tobacco: Never  Substance and Sexual Activity   Alcohol use: No   Drug use: No   Sexual activity: Not on file  Other Topics Concern   Not on file  Social History Narrative   Not on file   Social Determinants of Health   Financial Resource Strain: Not on file  Food Insecurity: Not on file  Transportation Needs: Not on file  Physical Activity: Not on file  Stress: Not on file  Social Connections: Not on file  Intimate Partner Violence: Not on file    FAMILY HISTORY: No family history on file.  ALLERGIES:  has No Known Allergies.  MEDICATIONS:  Current Outpatient Medications  Medication Sig Dispense Refill   cyclobenzaprine (FLEXERIL) 10 MG tablet Take 1 tablet (10 mg total) by mouth 2 (two) times daily as  needed for muscle spasms. 20 tablet 0   gabapentin (NEURONTIN) 100 MG capsule Take 2 capsules (200 mg total) by mouth at bedtime. (Patient not taking: Reported on 06/09/2017) 60 capsule 3   meloxicam (MOBIC) 7.5 MG tablet Take 1 tablet (7.5 mg total) by mouth daily. 15 tablet 0   traMADol (ULTRAM) 50 MG tablet Take 1 tablet (50 mg total) by mouth every 12 (twelve) hours  as needed. 30 tablet 0   venlafaxine XR (EFFEXOR XR) 37.5 MG 24 hr capsule Take 1 capsule (37.5 mg total) by mouth daily with breakfast. (Patient not taking: Reported on 06/09/2017) 30 capsule 1   No current facility-administered medications for this visit.     PHYSICAL EXAMINATION: ECOG PERFORMANCE STATUS: 0 - Asymptomatic  Vitals:   12/31/22 1320  BP: 130/87  Pulse: (!) 115  Resp: 18  Temp: 98.1 F (36.7 C)  SpO2: 99%   Filed Weights   12/31/22 1320  Weight: 206 lb 1.6 oz (93.5 kg)    PE deferred today in lieu of counseling   LABORATORY DATA:  I have reviewed the data as listed Lab Results  Component Value Date   WBC 8.9 12/15/2022   HGB 17.8 (H) 12/15/2022   HCT 50.0 12/15/2022   MCV 88.7 12/15/2022   PLT 261 12/15/2022     Chemistry      Component Value Date/Time   NA 138 12/15/2022 0829   K 4.0 12/15/2022 0829   CL 109 12/15/2022 0829   CO2 21 (L) 12/15/2022 0829   BUN 18 12/15/2022 0829   CREATININE 0.89 12/15/2022 0829      Component Value Date/Time   CALCIUM 10.0 12/15/2022 0829   ALKPHOS 48 12/15/2022 0829   AST 22 12/15/2022 0829   ALT 64 (H) 12/15/2022 0829   BILITOT 0.3 12/15/2022 0829     Most recent labs reviewed. Hemochromatosis testing showed heterozygous for P histidine 63 ASP  RADIOGRAPHIC STUDIES: I have personally reviewed the radiological images as listed and agreed with the findings in the report. No results found.  All questions were answered. The patient knows to call the clinic with any problems, questions or concerns. I spent 30 minutes in the care of this patient including H and P, review of records, counseling and coordination of care.     Benay Pike, MD 12/31/2022 1:28 PM

## 2023-03-13 ENCOUNTER — Encounter: Payer: Self-pay | Admitting: Hematology and Oncology

## 2023-03-13 ENCOUNTER — Other Ambulatory Visit: Payer: Self-pay

## 2023-03-13 ENCOUNTER — Encounter (HOSPITAL_BASED_OUTPATIENT_CLINIC_OR_DEPARTMENT_OTHER): Payer: Self-pay

## 2023-03-13 DIAGNOSIS — W01118A Fall on same level from slipping, tripping and stumbling with subsequent striking against other sharp object, initial encounter: Secondary | ICD-10-CM | POA: Diagnosis not present

## 2023-03-13 DIAGNOSIS — S61411A Laceration without foreign body of right hand, initial encounter: Secondary | ICD-10-CM | POA: Insufficient documentation

## 2023-03-13 DIAGNOSIS — Z23 Encounter for immunization: Secondary | ICD-10-CM | POA: Diagnosis not present

## 2023-03-13 NOTE — ED Triage Notes (Addendum)
Pt cut right hand between 3rd and 4th finger on a metal pot.  Some white material noted.

## 2023-03-14 ENCOUNTER — Emergency Department (HOSPITAL_BASED_OUTPATIENT_CLINIC_OR_DEPARTMENT_OTHER): Payer: Commercial Managed Care - HMO

## 2023-03-14 ENCOUNTER — Emergency Department (HOSPITAL_BASED_OUTPATIENT_CLINIC_OR_DEPARTMENT_OTHER)
Admission: EM | Admit: 2023-03-14 | Discharge: 2023-03-14 | Disposition: A | Discharge status: Home / Self Care | Payer: Commercial Managed Care - HMO | Attending: Emergency Medicine | Admitting: Emergency Medicine

## 2023-03-14 DIAGNOSIS — S61411A Laceration without foreign body of right hand, initial encounter: Secondary | ICD-10-CM

## 2023-03-14 MED ORDER — LIDOCAINE HCL 2 % IJ SOLN
10.0000 mL | Freq: Once | INTRAMUSCULAR | Status: AC
  Administered 2023-03-14: 200 mg
  Filled 2023-03-14: qty 20

## 2023-03-14 MED ORDER — TETANUS-DIPHTH-ACELL PERTUSSIS 5-2.5-18.5 LF-MCG/0.5 IM SUSY
0.5000 mL | PREFILLED_SYRINGE | Freq: Once | INTRAMUSCULAR | Status: AC
  Administered 2023-03-14: 0.5 mL via INTRAMUSCULAR
  Filled 2023-03-14: qty 0.5

## 2023-03-14 MED ORDER — OXYCODONE-ACETAMINOPHEN 5-325 MG PO TABS
1.0000 | ORAL_TABLET | Freq: Once | ORAL | Status: AC
  Administered 2023-03-14: 1 via ORAL
  Filled 2023-03-14: qty 1

## 2023-03-14 NOTE — ED Provider Notes (Signed)
[facility] Provider Note   CSN: [id number] Arrival date & time: 03/13/23  2336     History  Chief Complaint  Patient presents with   Hand Injury    Willie Carter is a 37 y.o. male.  The history is provided by the patient.  Hand Injury Willie Carter is a 37 y.o. male who presents to the Emergency Department complaining of hand injury.  He presents emergency department for evaluation of right hand injury that happened about 1030.  He states that he was at his friend's house when he tripped and fell, having his hands land partially in a metal planter.  He complains of pain to the right dorsal hand.  No numbness.  No additional injuries.  Last tetanus is unknown.  He is right-hand dominant.  He has no known medical problems.     Home Medications Prior to Admission medications   Medication Sig Start Date End Date Taking? Authorizing Provider  cyclobenzaprine (FLEXERIL) 10 MG tablet Take 1 tablet (10 mg total) by mouth 2 (two) times daily as needed for muscle spasms. 11/02/17   Robinson, [place] N, PA-C  gabapentin (NEURONTIN) 100 MG capsule Take 2 capsules (200 mg total) by mouth at bedtime. Patient not taking: Reported on 06/09/2017 11/12/16   Judi Saa, DO  meloxicam (MOBIC) 7.5 MG tablet Take 1 tablet (7.5 mg total) by mouth daily. 11/02/17   Robinson, [place] N, PA-C  traMADol (ULTRAM) 50 MG tablet Take 1 tablet (50 mg total) by mouth every 12 (twelve) hours as needed. 06/03/17   Judi Saa, DO  venlafaxine XR (EFFEXOR XR) 37.5 MG 24 hr capsule Take 1 capsule (37.5 mg total) by mouth daily with breakfast. Patient not taking: Reported on 06/09/2017 02/18/17   Judi Saa, DO      Allergies    Patient has no known allergies.    Review of Systems   Review of Systems  All other systems reviewed and are negative.   Physical Exam Updated Vital Signs BP 123/82   Pulse 100   Temp 98.6 F (37 C) (Oral)   Resp  16   Ht 5\' 5"  (1.651 m)   Wt 90.7 kg   SpO2 97%   BMI 33.28 kg/m  Physical Exam Vitals and nursing note reviewed.  Constitutional:      Appearance: He is well-developed.  HENT:     Head: Normocephalic and atraumatic.  Cardiovascular:     Rate and Rhythm: Normal rate and regular rhythm.  Pulmonary:     Effort: Pulmonary effort is normal. No respiratory distress.  Musculoskeletal:     Comments: There is tenderness to palpation over the right fourth and fifth metacarpals.  There is an irregular laceration over the right dorsal hand that extends over the fourth MCP joint.  He is able to fully flex and extend against assistance in all digits of the right hand.  2+ radial pulse.  He is able to flex and extend at the wrist without difficulty.  Skin:    General: Skin is warm and dry.  Neurological:     Mental Status: He is alert and oriented to person, place, and time.  Psychiatric:        Behavior: Behavior normal.     ED Results / Procedures / Treatments   Labs (all labs ordered are listed, but only abnormal results are displayed) Labs Reviewed - No data to display  EKG None  Radiology DG  Hand Complete Right  Result Date: 03/14/2023 CLINICAL DATA:  The dorsal aspect of right hand on metal pot. EXAM: RIGHT HAND - COMPLETE 3+ VIEW COMPARISON:  None Available. FINDINGS: No acute fracture or dislocation. Remote posttraumatic deformity to the fifth metacarpal. Bandage material and soft tissue injury about the middle finger. IMPRESSION: No acute osseous abnormality. Electronically Signed   By: Minerva Fester M.D.   On: 03/14/2023 03:44    Procedures .Marland KitchenLaceration Repair  Date/Time: 03/14/2023 4:58 AM  Performed by: Tilden Fossa, MD Authorized by: Tilden Fossa, MD   Consent:    Consent obtained:  Verbal   Consent given by:  Patient   Risks discussed:  Infection, pain, poor cosmetic result, poor wound healing and need for additional repair Universal protocol:    Patient  identity confirmed:  Verbally with patient Anesthesia:    Anesthesia method:  Local infiltration   Local anesthetic:  Lidocaine 2% w/o epi Laceration details:    Location:  Hand   Hand location:  R hand, dorsum   Length (cm):  4 Pre-procedure details:    Preparation:  Patient was prepped and draped in usual sterile fashion Exploration:    Hemostasis achieved with:  Direct pressure   Imaging obtained: x-ray     Imaging outcome: foreign body not noted     Wound exploration: wound explored through full range of motion     Wound extent: no tendon damage and no underlying fracture     Contaminated: no   Treatment:    Area cleansed with:  Povidone-iodine and saline   Amount of cleaning:  Standard Skin repair:    Repair method:  Sutures   Suture size:  4-0   Suture material:  Prolene   Suture technique:  Simple interrupted   Number of sutures:  4 Approximation:    Approximation:  Close Repair type:    Repair type:  Intermediate Post-procedure details:    Procedure completion:  Tolerated well, no immediate complications     Medications Ordered in ED Medications  Tdap (BOOSTRIX) injection 0.5 mL (0.5 mLs Intramuscular Given 03/14/23 0323)  lidocaine (XYLOCAINE) 2 % (with pres) injection 200 mg (200 mg Other Given by Other 03/14/23 0323)  oxyCODONE-acetaminophen (PERCOCET/ROXICET) 5-325 MG per tablet 1 tablet (1 tablet Oral Given 03/14/23 4098)    ED Course/ Medical Decision Making/ A&P                             Medical Decision Making Amount and/or Complexity of Data Reviewed Radiology: ordered.  Risk Prescription drug management.   Patient here for evaluation of right hand injury.  He has a complex laceration to the dorsal right hand.  Images are negative for fracture or foreign body-images personally reviewed and interpreted, agree with radiologist interpretation.  On examination he has a laceration that is irregular, deep and traverses the MCP joint on the right.  He  was evaluated throughout range of motion after anesthetizing the wound and it was evaluated throughout its depth.  There was no evidence of tendon injury.  Tendon was not visualized.  He did have a small amount of white fascial tissue that was noted.  He is able to flex and extend fully against resistance throughout the digit.  He has sensation to light touch intact throughout the digit.  Wound was repaired with simple interrupted sutures per procedure note.  Discussed with patient local wound care as well as outpatient follow-up for evidence  of developing infection.  He was provided with a splint for comfort and to assist with him being able to work while he heals that he does not need to wear this at all times.        Final Clinical Impression(s) / ED Diagnoses Final diagnoses:  Laceration of right hand without foreign body, initial encounter    Rx / DC Orders ED Discharge Orders     None         Tilden Fossa, MD 03/14/23 0501

## 2023-04-01 ENCOUNTER — Inpatient Hospital Stay: Payer: Commercial Managed Care - HMO | Attending: Hematology and Oncology | Admitting: Hematology and Oncology

## 2023-04-01 ENCOUNTER — Inpatient Hospital Stay: Payer: Commercial Managed Care - HMO
